# Patient Record
Sex: Female | Born: 1975 | Race: Black or African American | Hispanic: No | Marital: Single | Smoking: Never smoker
Health system: Southern US, Community
[De-identification: ages and names within clinical notes are randomized; demographics above are authoritative.]

## PROBLEM LIST (undated history)

## (undated) DIAGNOSIS — D649 Anemia, unspecified: Secondary | ICD-10-CM

## (undated) DIAGNOSIS — E669 Obesity, unspecified: Secondary | ICD-10-CM

## (undated) DIAGNOSIS — F419 Anxiety disorder, unspecified: Secondary | ICD-10-CM

## (undated) DIAGNOSIS — F99 Mental disorder, not otherwise specified: Secondary | ICD-10-CM

## (undated) HISTORY — DX: Anemia, unspecified: D64.9

## (undated) HISTORY — DX: Obesity, unspecified: E66.9

## (undated) HISTORY — DX: Mental disorder, not otherwise specified: F99

## (undated) HISTORY — DX: Anxiety disorder, unspecified: F41.9

---

## 2004-10-12 ENCOUNTER — Emergency Department (HOSPITAL_COMMUNITY): Admission: EM | Admit: 2004-10-12 | Discharge: 2004-10-12 | Payer: Self-pay | Admitting: Emergency Medicine

## 2007-09-19 ENCOUNTER — Emergency Department (HOSPITAL_COMMUNITY): Admission: EM | Admit: 2007-09-19 | Discharge: 2007-09-20 | Payer: Self-pay | Admitting: Emergency Medicine

## 2007-09-20 ENCOUNTER — Emergency Department (HOSPITAL_COMMUNITY): Admission: EM | Admit: 2007-09-20 | Discharge: 2007-09-20 | Payer: Self-pay | Admitting: Emergency Medicine

## 2007-09-20 ENCOUNTER — Inpatient Hospital Stay (HOSPITAL_COMMUNITY): Admission: AD | Admit: 2007-09-20 | Discharge: 2007-09-25 | Payer: Self-pay | Admitting: *Deleted

## 2007-09-20 ENCOUNTER — Ambulatory Visit: Payer: Self-pay | Admitting: *Deleted

## 2010-12-04 ENCOUNTER — Encounter: Payer: Self-pay | Admitting: Family Medicine

## 2011-03-28 NOTE — Discharge Summary (Signed)
NAME:  Madeline Tucker, Madeline Tucker               ACCOUNT NO.:  [id number]   MEDICAL RECORD NO.:  [id number]          PATIENT TYPE:  IPS   LOCATION:  0400                          FACILITY:  [facility]   PHYSICIAN:  Anselm Jungling, MD  DATE OF BIRTH:  Jan 10, 1976   DATE OF ADMISSION:  09/20/2007  DATE OF DISCHARGE:  09/25/2007                               DISCHARGE SUMMARY   IDENTIFYING DATA AND REASON FOR ADMISSION:  This was an inpatient  psychiatric admission for Madeline Tucker, a 35 year old African American female  admitted due to psychosis and paranoia of 1 week duration.  She was  brought in by her family.  Please refer to the admission note for  further details pertaining to the symptoms, circumstances and history  that led to hospitalization.  She was given initial Axis I diagnosis of  psychosis NOS, and rule out substance-induced psychosis.   MEDICAL AND LABORATORY:  The patient was medically and physically  assessed by the psychiatric nurse practitioner.  She was in good health  without any active or chronic medical problems.  However, tachycardia  was noted throughout her stay, and the etiology of this was not clear.  Review of older [facility] clinical visits indicated that she had had a  previous history of tachycardia, without clear known cause.  There was  no particular suspicion that this was related to the patient's current  medications.  She had no symptoms such as lightheadedness, orthostatic  hypotension, syncope, etc..  It was determined that it was best to have  this followed up by her usual medical physician within 2 weeks following  her discharge.  There were no other significant medical concerns.   HOSPITAL COURSE:  The patient was admitted to the adult inpatient  psychiatric service.  She presented as a well-nourished, well-developed  woman who showed latent speech, disorientation, and dull, flat affect.  She was calm and cooperative.   She was started on a trial of Risperdal with  Depakote.  She responded to  this fairly rapidly, and on the next day she reported that she was  feeling better and stated her opinion that she thought medication was  helping her mood.  She was eating and sleeping well.  However, her level  of insight and judgment was poor, and she was asking for discharge.  She  was continued on the regimen of Risperdal and Depakote.  She continued  to request discharge on a daily basis, but she was not discharged until  the sixth hospital day, at which point she appeared more capable of the  insight and her thinking was more organized.   Her sleep did not remain stable, and staff reported the patient being up  quite a bit during the night.  This raised the possibility that this was  an affective psychosis and it was at this point that Depakote was  determined to be a more important aspect of her overall medication  regimen.  The dose was continued at 1000 mg q.h.s..   The patient was discharged on the sixth hospital day.  The case manager  had been  in contact with the patient's family, and the patient was  discharged to her family, with a supply of her medication.  She agreed  to the following aftercare plan.   AFTERCARE:  The patient was to follow-up with [facility]  in [place], with an appointment on 11/14.  She was to follow-up with  Dr. Alver Fisher at [facility] on 11/19.  She was also to  be seen at [facility] on October 09, 2007.  She  was instructed to follow-up with Dr. Regino Schultze, within 2 weeks regarding  her heart rate.   DISCHARGE MEDICATIONS:  Risperdal 1 mg t.i.d. and 2 mg q.h.s., and  Depakote ER 1000 mg q.h.s.Marland Kitchen  Depakote level was pending at the time of  this dictation.   DISCHARGE DIAGNOSES:  AXIS I: Bipolar disorder, most recently mixed with  psychotic features, resolving.  AXIS II: Deferred.  AXIS III: No acute or chronic illnesses, tachycardia, unknown origin.  AXIS IV:  Stressors severe.  AXIS V: GAF on discharge 55.      Anselm Jungling, MD  Electronically Signed     SPB/MEDQ  D:  09/27/2007  T:  09/27/2007  Job:  [id number]

## 2011-03-28 NOTE — H&P (Signed)
NAME:  Madeline Tucker, BONNIE NO.:  [id number]   MEDICAL RECORD NO.:  [id number]          PATIENT TYPE:  IPS   LOCATION:  0407                          FACILITY:  [facility]   PHYSICIAN:  Jasmine Pang, M.D. DATE OF BIRTH:  08-Sep-1976   DATE OF ADMISSION:  09/20/2007  DATE OF DISCHARGE:                       PSYCHIATRIC ADMISSION ASSESSMENT   REASON FOR ADMISSION:  This is an involuntary admission to the services  of Dr. Milford Cage.   IDENTIFYING INFORMATION/JUSTIFICATION FOR ADMISSION AND CARE:  This is a  35 year old, single, African American female.  The commitment papers  indicate that the examiner felt that the patient had an acute psychotic  break.  She feels she is being stalked and about to be harmed.  She was  extremely paranoid and a potential flight risk due to her paranoia.  Apparently her family had brought her in to [facility], and this is  how she came to be committed.  Today the patient says that a potential  romance became dramatic, and then she got worried about all this and  felt funny in her stomach.  This made her stop eating, and today she  does have the beginnings of a UTI.  She has 3 to 6 WBC in her urine.  She denies ever actually speaking to the gentleman in question, and  completely denies ever having had any sex with him.   PAST PSYCHIATRIC HISTORY:  There is no history for any prior psychiatric  events.   SOCIAL HISTORY:  She states that she went to [employer] for 2 years.  She has  never married.  She has no children.  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   FAMILY HISTORY:  None.   ALCOHOL AND DRUG HISTORY:  She denies.   PRIMARY CARE PHYSICIAN:  Dr. Lucky Cowboy.  Her psychiatrist is Dr.  Vassie Loll.   PAST MEDICAL HISTORY:  None.   CURRENT MEDICATIONS:  None.   DRUG ALLERGIES:  None.   PHYSICAL EXAMINATION:  Positive physical findings:  As already stated,  she does have the beginnings of a UTI with 3 to  6 WBCs in her urine.  The remainder of her physical exam is unremarkable.  She was medically  cleared in the ED at [facility].  VITAL SIGNS ON ADMISSION:  Height 67 inches, weight 180 pounds,  temperature 98.2, blood pressure 158/92, pulse 120, respirations 22.   ADMITTING IMPRESSION:   AXIS I:  Psychosis.   AXIS II:  Deferred.   AXIS III:  None.   AXIS IV:  Severe.   AXIS V:  30.   Dr. Electa Sniff had seen the patient earlier in the day.  He was wondering  if her psychosis could have been drug-induced, but her UDS was negative,  and not really sure if this was just a brief psychotic reaction or  whatever.   PLAN:  1. Admit for safety and stabilization.  2. She was empirically started on Zyprexa Zydis 10 mg p.o. p.r.n.      agitation.  3. She was ordered Risperdal 0.5 mg  p.o. t.i.d. today.  4. Will be also getting some Cipro 250 mg p.o. b.i.d. for UTI x3 days.  5. Will have the case manager have a session with her family to see if      she has cleared.      Mickie Leonarda Salon, P.A.-C.      Jasmine Pang, M.D.  Electronically Signed    MD/MEDQ  D:  09/21/2007  T:  09/22/2007  Job:  [id number]

## 2011-08-22 LAB — BASIC METABOLIC PANEL
BUN: 5 — ABNORMAL LOW
BUN: 5 — ABNORMAL LOW
CO2: 31
Creatinine, Ser: 0.69
Creatinine, Ser: 0.87
GFR calc Af Amer: >60
GFR calc non Af Amer: >60

## 2011-08-22 LAB — URINALYSIS, ROUTINE W REFLEX MICROSCOPIC
Glucose, UA: NEGATIVE
Ketones, ur: 15 — AB
Leukocytes, UA: NEGATIVE
Nitrite: NEGATIVE
Specific Gravity, Urine: 1.02
Urobilinogen, UA: 1
pH: 5.5
pH: 6.5

## 2011-08-22 LAB — CBC
HCT: 41.5
MCHC: 33.6
MCV: 86
Platelets: 256
RBC: 4.83
RDW: 13.5
WBC: 9.2
WBC: 9.6

## 2011-08-22 LAB — DIFFERENTIAL
Eosinophils Absolute: 0
Eosinophils Relative: 0
Lymphocytes Relative: 13
Lymphs Abs: 1.2
Lymphs Abs: 1.8
Monocytes Absolute: 0.3
Monocytes Relative: 4
Neutro Abs: 7.9 — ABNORMAL HIGH
Neutrophils Relative %: 76
Neutrophils Relative %: 82 — ABNORMAL HIGH

## 2011-08-22 LAB — RAPID URINE DRUG SCREEN, HOSP PERFORMED
Amphetamines: NOT DETECTED
Barbiturates: NOT DETECTED
Cocaine: NOT DETECTED
Opiates: NOT DETECTED
Tetrahydrocannabinol: NOT DETECTED

## 2011-08-22 LAB — URINE MICROSCOPIC-ADD ON

## 2011-08-22 LAB — PREGNANCY, URINE
Preg Test, Ur: NEGATIVE
Preg Test, Ur: NEGATIVE

## 2011-08-22 LAB — ETHANOL: Alcohol, Ethyl (B): <5

## 2011-08-22 LAB — VALPROIC ACID LEVEL: Valproic Acid Lvl: <1 — ABNORMAL LOW

## 2020-08-16 ENCOUNTER — Other Ambulatory Visit (HOSPITAL_COMMUNITY): Payer: Self-pay | Admitting: Nurse Practitioner

## 2020-08-16 DIAGNOSIS — Z1231 Encounter for screening mammogram for malignant neoplasm of breast: Secondary | ICD-10-CM

## 2020-08-24 ENCOUNTER — Other Ambulatory Visit: Payer: Self-pay | Admitting: *Deleted

## 2020-08-24 ENCOUNTER — Other Ambulatory Visit (HOSPITAL_COMMUNITY): Payer: Self-pay | Admitting: *Deleted

## 2020-08-24 DIAGNOSIS — E041 Nontoxic single thyroid nodule: Secondary | ICD-10-CM

## 2020-08-27 ENCOUNTER — Other Ambulatory Visit: Payer: Self-pay | Admitting: *Deleted

## 2020-08-27 ENCOUNTER — Other Ambulatory Visit (HOSPITAL_COMMUNITY): Payer: Self-pay | Admitting: *Deleted

## 2020-08-27 DIAGNOSIS — R109 Unspecified abdominal pain: Secondary | ICD-10-CM

## 2020-08-27 DIAGNOSIS — N912 Amenorrhea, unspecified: Secondary | ICD-10-CM

## 2020-08-27 DIAGNOSIS — R1013 Epigastric pain: Secondary | ICD-10-CM

## 2020-09-06 ENCOUNTER — Other Ambulatory Visit: Payer: Self-pay

## 2020-09-06 ENCOUNTER — Ambulatory Visit (HOSPITAL_COMMUNITY)
Admission: RE | Admit: 2020-09-06 | Discharge: 2020-09-06 | Disposition: A | Discharge status: Home / Self Care | Payer: Medicaid Other | Source: Ambulatory Visit | Attending: *Deleted | Admitting: *Deleted

## 2020-09-06 DIAGNOSIS — R1013 Epigastric pain: Secondary | ICD-10-CM

## 2020-09-06 DIAGNOSIS — E041 Nontoxic single thyroid nodule: Secondary | ICD-10-CM | POA: Insufficient documentation

## 2020-09-06 DIAGNOSIS — R109 Unspecified abdominal pain: Secondary | ICD-10-CM | POA: Diagnosis present

## 2020-09-06 DIAGNOSIS — N912 Amenorrhea, unspecified: Secondary | ICD-10-CM | POA: Insufficient documentation

## 2020-09-08 ENCOUNTER — Other Ambulatory Visit: Payer: Self-pay

## 2020-09-08 ENCOUNTER — Ambulatory Visit (HOSPITAL_COMMUNITY)
Admission: RE | Admit: 2020-09-08 | Discharge: 2020-09-08 | Disposition: A | Discharge status: Home / Self Care | Payer: Medicaid Other | Source: Ambulatory Visit | Attending: Nurse Practitioner | Admitting: Nurse Practitioner

## 2020-09-08 DIAGNOSIS — Z1231 Encounter for screening mammogram for malignant neoplasm of breast: Secondary | ICD-10-CM

## 2020-09-13 ENCOUNTER — Encounter: Payer: Self-pay | Admitting: Internal Medicine

## 2020-09-21 ENCOUNTER — Other Ambulatory Visit (HOSPITAL_COMMUNITY): Payer: Self-pay | Admitting: *Deleted

## 2020-09-21 DIAGNOSIS — R1031 Right lower quadrant pain: Secondary | ICD-10-CM

## 2020-09-21 DIAGNOSIS — R9389 Abnormal findings on diagnostic imaging of other specified body structures: Secondary | ICD-10-CM

## 2020-10-05 ENCOUNTER — Encounter (HOSPITAL_COMMUNITY): Payer: Self-pay

## 2020-10-05 ENCOUNTER — Ambulatory Visit (HOSPITAL_COMMUNITY): Payer: Medicaid Other

## 2020-10-15 ENCOUNTER — Other Ambulatory Visit: Payer: Self-pay | Admitting: *Deleted

## 2020-10-15 ENCOUNTER — Other Ambulatory Visit (HOSPITAL_COMMUNITY): Payer: Self-pay | Admitting: *Deleted

## 2020-10-15 DIAGNOSIS — E041 Nontoxic single thyroid nodule: Secondary | ICD-10-CM

## 2020-10-20 ENCOUNTER — Ambulatory Visit: Payer: Medicaid Other | Admitting: Internal Medicine

## 2020-10-27 ENCOUNTER — Other Ambulatory Visit: Payer: Self-pay

## 2020-10-27 ENCOUNTER — Encounter (HOSPITAL_COMMUNITY): Payer: Self-pay

## 2020-10-27 ENCOUNTER — Ambulatory Visit (HOSPITAL_COMMUNITY)
Admission: RE | Admit: 2020-10-27 | Discharge: 2020-10-27 | Disposition: A | Discharge status: Home / Self Care | Payer: Medicaid Other | Source: Ambulatory Visit | Attending: *Deleted | Admitting: *Deleted

## 2020-10-27 DIAGNOSIS — E041 Nontoxic single thyroid nodule: Secondary | ICD-10-CM | POA: Insufficient documentation

## 2020-10-27 MED ORDER — LIDOCAINE HCL (PF) 2 % IJ SOLN
INTRAMUSCULAR | Status: AC
  Administered 2020-10-27: 10 mL via INTRADERMAL
  Filled 2020-10-27: qty 10

## 2020-10-27 MED ORDER — LIDOCAINE HCL (PF) 2 % IJ SOLN: 10.0000 mL | Freq: Once | INTRAMUSCULAR | Status: AC

## 2020-10-27 NOTE — Discharge Instructions (Signed)
Thyroid Needle Biopsy, Care After This sheet gives you information about how to care for yourself after your procedure. Your health care provider may also give you more specific instructions. If you have problems or questions, contact your health care provider. What can I expect after the procedure? After the procedure, it is common to have:  Soreness and tenderness that lasts for a few days.  Bruising where the needle was inserted (puncture site). Follow these instructions at home:   Take over-the-counter and prescription medicines only as told by your health care provider.  To help ease discomfort, keep your head raised (elevated) when you are lying down. When you move from lying down to sitting up, use both hands to support the back of your head and neck.  Check your puncture site every day for signs of infection. Check for: ? Redness, swelling, or pain. ? Fluid or blood. ? Warmth. ? Pus or a bad smell.  Return to your normal activities as told by your health care provider. Ask your health care provider what activities are safe for you.  Keep all follow-up visits as told by your health care provider. This is important. Contact a health care provider if:  You have redness, swelling, or pain around your puncture site.  You have fluid or blood coming from your puncture site.  Your puncture site feels warm to the touch.  You have pus or a bad smell coming from your puncture site.  You have a fever. Get help right away if:  You have severe bleeding from the puncture site.  You have difficulty swallowing.  You have swollen glands (lymph nodes) in your neck. Summary  It is common to have some bruising and soreness where the needle was inserted in your lower front neck area (puncture site).  Check your puncture site every day for signs of infection, such as redness, swelling, or pain.  Get help right away if you have severe bleeding from your puncture site. This  information is not intended to replace advice given to you by your health care provider. Make sure you discuss any questions you have with your health care provider. Document Revised: 10/12/2017 Document Reviewed: 08/13/2017 Elsevier Patient Education  2020 Elsevier Inc.  

## 2020-10-27 NOTE — Sedation Documentation (Signed)
PT tolerated left mid thyroid biopsy procedure well today. Labs obtained and sent for pathology at 1155. PT ambulatory at discharge with no acute distress noted and verbalized understanding of discharge instructions.

## 2020-10-28 LAB — CYTOLOGY - NON PAP

## 2020-11-01 ENCOUNTER — Encounter: Payer: Self-pay | Admitting: Internal Medicine

## 2020-11-02 ENCOUNTER — Ambulatory Visit (HOSPITAL_COMMUNITY)
Admission: RE | Admit: 2020-11-02 | Discharge: 2020-11-02 | Disposition: A | Discharge status: Home / Self Care | Payer: Medicaid Other | Source: Ambulatory Visit | Attending: *Deleted | Admitting: *Deleted

## 2020-11-02 ENCOUNTER — Other Ambulatory Visit: Payer: Self-pay

## 2020-11-02 DIAGNOSIS — R1031 Right lower quadrant pain: Secondary | ICD-10-CM | POA: Diagnosis present

## 2020-11-02 DIAGNOSIS — R9389 Abnormal findings on diagnostic imaging of other specified body structures: Secondary | ICD-10-CM | POA: Insufficient documentation

## 2020-11-02 MED ORDER — GADOBUTROL 1 MMOL/ML IV SOLN
10.0000 mL | Freq: Once | INTRAVENOUS | Status: AC | PRN
  Administered 2020-11-02: 10 mL via INTRAVENOUS

## 2020-11-22 ENCOUNTER — Encounter: Payer: Self-pay | Admitting: Obstetrics & Gynecology

## 2020-11-22 ENCOUNTER — Other Ambulatory Visit: Payer: Self-pay

## 2020-11-22 ENCOUNTER — Ambulatory Visit (INDEPENDENT_AMBULATORY_CARE_PROVIDER_SITE_OTHER): Payer: Medicaid Other | Admitting: Obstetrics & Gynecology

## 2020-11-22 VITALS — BP 136/80 | HR 84 | Wt 232.0 lb

## 2020-11-22 DIAGNOSIS — N911 Secondary amenorrhea: Secondary | ICD-10-CM | POA: Diagnosis not present

## 2020-11-22 MED ORDER — MEDROXYPROGESTERONE ACETATE 2.5 MG PO TABS: 2.5000 mg | ORAL_TABLET | Freq: Every day | ORAL | 11 refills | Status: DC

## 2020-11-22 NOTE — Progress Notes (Signed)
Chief Complaint  Patient presents with  . Post Menopausal Bleeding    Pt has not had a period since 2008.       44 y.o. G0P0000 No LMP recorded (lmp unknown). Patient is postmenopausal. The current method of family planning is none.  Outpatient Encounter Medications as of 11/22/2020  Medication Sig  . medroxyPROGESTERone (PROVERA) 2.5 MG tablet Take 1 tablet (2.5 mg total) by mouth daily.   No facility-administered encounter medications on file as of 11/22/2020.    Subjective Pt had not had a period for 12-13 years and bled in December for 7 days Recent sonogram normal Not sexually active Past Medical History:  Diagnosis Date  . Anxiety   . Mental disorder     History reviewed. No pertinent surgical history.  OB History    Gravida  0   Para  0   Term  0   Preterm  0   AB  0   Living  0     SAB  0   IAB  0   Ectopic  0   Multiple  0   Live Births  0           No Known Allergies  Social History   Socioeconomic History  . Marital status: Single    Spouse name: Not on file  . Number of children: Not on file  . Years of education: Not on file  . Highest education level: Not on file  Occupational History  . Not on file  Tobacco Use  . Smoking status: Never Smoker  . Smokeless tobacco: Never Used  Vaping Use  . Vaping Use: Never used  Substance and Sexual Activity  . Alcohol use: Never  . Drug use: Never  . Sexual activity: Never    Birth control/protection: None  Other Topics Concern  . Not on file  Social History Narrative  . Not on file   Social Determinants of Health   Financial Resource Strain: Low Risk   . Difficulty of Paying Living Expenses: Not hard at all  Food Insecurity: No Food Insecurity  . Worried About Charity fundraiser in the Last Year: Never true  . Ran Out of Food in the Last Year: Never true  Transportation Needs: No Transportation Needs  . Lack of Transportation (Medical): No  . Lack of  Transportation (Non-Medical): No  Physical Activity: Insufficiently Active  . Days of Exercise per Week: 2 days  . Minutes of Exercise per Session: 10 min  Stress: No Stress Concern Present  . Feeling of Stress : Only a little  Social Connections: Socially Isolated  . Frequency of Communication with Friends and Family: Never  . Frequency of Social Gatherings with Friends and Family: Never  . Attends Religious Services: Never  . Active Member of Clubs or Organizations: Yes  . Attends Archivist Meetings: Never  . Marital Status: Never married    Family History  Problem Relation Age of Onset  . Cancer Mother   . Diabetes Mother     Medications:       Current Outpatient Medications:  .  medroxyPROGESTERone (PROVERA) 2.5 MG tablet, Take 1 tablet (2.5 mg total) by mouth daily., Disp: 30 tablet, Rfl: 11  Objective Blood pressure 136/80, pulse 84, weight 232 lb (105.2 kg).  Gen WDWN NAD some mental deficiency  Pt delcines Pap or pelvic exam  Pertinent ROS No burning with urination, frequency or urgency No nausea, vomiting or  diarrhea Nor fever chills or other constitutional symptoms   Labs or studies  Reviewed labs and sonogram which are all normal   Impression Diagnoses this Encounter::   ICD-10-CM   1. Secondary amenorrhea  N91.1     Established relevant diagnosis(es):   Plan/Recommendations: Meds ordered this encounter  Medications  . medroxyPROGESTERone (PROVERA) 2.5 MG tablet    Sig: Take 1 tablet (2.5 mg total) by mouth daily.    Dispense:  30 tablet    Refill:  11    Labs or Scans Ordered: No orders of the defined types were placed in this encounter.   Management:: Begin daily provera 2.5 mg to provide endometrial protection from anovulation and lack of cyclical provera  Follow up Return in about 6 months (around 05/22/2021) for Follow up, with Dr Elonda Husky.        Face to face time:  15 minutes  Greater than 50% of the visit time was  spent in counseling and coordination of care with the patient.  The summary and outline of the counseling and care coordination is summarized in the note above.   All questions were answered.

## 2020-11-25 ENCOUNTER — Other Ambulatory Visit: Payer: Self-pay

## 2020-11-25 ENCOUNTER — Ambulatory Visit (INDEPENDENT_AMBULATORY_CARE_PROVIDER_SITE_OTHER): Payer: Medicaid Other | Admitting: Internal Medicine

## 2020-11-25 ENCOUNTER — Encounter: Payer: Self-pay | Admitting: *Deleted

## 2020-11-25 ENCOUNTER — Encounter: Payer: Self-pay | Admitting: Internal Medicine

## 2020-11-25 VITALS — BP 115/82 | HR 97 | Temp 96.8°F | Ht 66.0 in | Wt 228.0 lb

## 2020-11-25 DIAGNOSIS — R1031 Right lower quadrant pain: Secondary | ICD-10-CM | POA: Diagnosis not present

## 2020-11-25 DIAGNOSIS — R1032 Left lower quadrant pain: Secondary | ICD-10-CM | POA: Diagnosis not present

## 2020-11-25 NOTE — Progress Notes (Signed)
Primary Care Physician:  Patient, No Pcp Per Primary Gastroenterologist:  Dr. Abbey Chatters  Chief Complaint  Patient presents with  . Abdominal Pain    Right and upper abd    HPI:   Madeline Tucker is a 45 y.o. female who presents to the clinic today by referral from her PCP [facility] for evaluation for abdominal pain.  Patient states her abdominal pain is primarily right lower quadrant radiates to the left lower quadrant as well.  States this is constant pain, 5 out of 10.  Notes abdominal tenderness as well.  Denies any change in her bowel habits.  No constipation or diarrhea.  No melena hematochezia.  No unintentional weight loss.  No previous colonoscopy, denies any family history of colorectal malignancy.  Denies any upper GI symptoms including reflux, heartburn, dysphagia/odynophagia, chest pain.  She had an abdominal ultrasound 09/06/2020 which showed normal gallbladder but interestingly showed questionable subtle masses up to 15 mm in the liver.  Subsequent MRI abdomen showed normal-appearing liver.  Patient was evaluated by gynecology and had a pelvic ultrasound which was WNL  Past Medical History:  Diagnosis Date  . Anxiety   . Mental disorder     No past surgical history on file.  Current Outpatient Medications  Medication Sig Dispense Refill  . medroxyPROGESTERone (PROVERA) 2.5 MG tablet Take 1 tablet (2.5 mg total) by mouth daily. 30 tablet 11   No current facility-administered medications for this visit.    Allergies as of 11/25/2020  . (No Known Allergies)    Family History  Problem Relation Age of Onset  . Cancer Mother   . Diabetes Mother     Social History   Socioeconomic History  . Marital status: Single    Spouse name: Not on file  . Number of children: Not on file  . Years of education: Not on file  . Highest education level: Not on file  Occupational History  . Not on file  Tobacco Use  . Smoking status: Never Smoker  . Smokeless tobacco:  Never Used  Vaping Use  . Vaping Use: Never used  Substance and Sexual Activity  . Alcohol use: Never  . Drug use: Never  . Sexual activity: Never    Birth control/protection: None  Other Topics Concern  . Not on file  Social History Narrative  . Not on file   Social Determinants of Health   Financial Resource Strain: Low Risk   . Difficulty of Paying Living Expenses: Not hard at all  Food Insecurity: No Food Insecurity  . Worried About Charity fundraiser in the Last Year: Never true  . Ran Out of Food in the Last Year: Never true  Transportation Needs: No Transportation Needs  . Lack of Transportation (Medical): No  . Lack of Transportation (Non-Medical): No  Physical Activity: Insufficiently Active  . Days of Exercise per Week: 2 days  . Minutes of Exercise per Session: 10 min  Stress: No Stress Concern Present  . Feeling of Stress : Only a little  Social Connections: Socially Isolated  . Frequency of Communication with Friends and Family: Never  . Frequency of Social Gatherings with Friends and Family: Never  . Attends Religious Services: Never  . Active Member of Clubs or Organizations: Yes  . Attends Archivist Meetings: Never  . Marital Status: Never married  Intimate Partner Violence: Not At Risk  . Fear of Current or Ex-Partner: No  . Emotionally Abused: No  .  Physically Abused: No  . Sexually Abused: No    Subjective: Review of Systems  Constitutional: Negative for chills and fever.  HENT: Negative for congestion and hearing loss.   Eyes: Negative for blurred vision and double vision.  Respiratory: Negative for cough and shortness of breath.   Cardiovascular: Negative for chest pain and palpitations.  Gastrointestinal: Positive for abdominal pain. Negative for blood in stool, constipation, diarrhea, heartburn, melena and vomiting.  Genitourinary: Negative for dysuria and urgency.  Musculoskeletal: Negative for joint pain and myalgias.  Skin:  Negative for itching and rash.  Neurological: Negative for dizziness and headaches.  Psychiatric/Behavioral: Negative for depression. The patient is not nervous/anxious.        Objective: BP 115/82   Pulse 97   Temp (!) 96.8 F (36 C) (Temporal)   Ht 5\' 6"  (1.676 m)   Wt 228 lb (103.4 kg)   LMP  (LMP Unknown)   BMI 36.80 kg/m  Physical Exam Constitutional:      Appearance: Normal appearance. She is obese.  HENT:     Head: Normocephalic and atraumatic.  Eyes:     Extraocular Movements: Extraocular movements intact.     Conjunctiva/sclera: Conjunctivae normal.  Cardiovascular:     Rate and Rhythm: Normal rate and regular rhythm.  Pulmonary:     Effort: Pulmonary effort is normal.     Breath sounds: Normal breath sounds.  Abdominal:     General: Bowel sounds are normal.     Palpations: Abdomen is soft.  Musculoskeletal:        General: No swelling. Normal range of motion.     Cervical back: Normal range of motion and neck supple.  Skin:    General: Skin is warm and dry.     Coloration: Skin is not jaundiced.  Neurological:     General: No focal deficit present.     Mental Status: She is alert and oriented to person, place, and time.  Psychiatric:        Mood and Affect: Mood normal.        Behavior: Behavior normal.      Assessment: *Abdominal pain-chronic lower left and lower right quadrant  Plan: Etiology of patient's abdominal pain unclear.  She has had MRI of her abdomen which was unremarkable though this did not evaluate the pelvis.  She did have a pelvic ultrasound done which was unremarkable.  Given her worsening symptoms we will evaluate with colonoscopy to rule out colitis, diverticulitis, polyps, masses, or other.  The risks including infection, bleed, or perforation as well as benefits, limitations, alternatives and imponderables have been reviewed with the patient. Questions have been answered. All parties agreeable.  Thank you Royce Macadamia for the  kind referral.  11/25/2020 4:29 PM   Disclaimer: This note was dictated with voice recognition software. Similar sounding words can inadvertently be transcribed and may not be corrected upon review.

## 2020-11-25 NOTE — Patient Instructions (Signed)
We will schedule you for colonoscopy to further evaluate your abdominal pain.  Further recommendations to follow.  At [facility] we value your feedback. You may receive a survey about your visit today. Please share your experience as we strive to create trusting relationships with our patients to provide genuine, compassionate, quality care.  We appreciate your understanding and patience as we review any laboratory studies, imaging, and other diagnostic tests that are ordered as we care for you. Our office policy is 5 business days for review of these results, and any emergent or urgent results are addressed in a timely manner for your best interest. If you do not hear from our office in 1 week, please contact us.   We also encourage the use of MyChart, which contains your medical information for your review as well. If you are not enrolled in this feature, an access code is on this after visit summary for your convenience. Thank you for allowing [place] to be involved in your care.  It was great to see you today!  I hope you have a great rest of your winter!!    Madeline Tucker. Abbey Chatters, D.O. Gastroenterology and Hepatology [facility]

## 2020-11-26 ENCOUNTER — Telehealth: Payer: Self-pay | Admitting: Internal Medicine

## 2020-11-26 NOTE — Telephone Encounter (Signed)
LMOVM for pt to call back. Yes she still needs someone with her on transport van. Driver can not be responsible for her.

## 2020-11-26 NOTE — Telephone Encounter (Signed)
Pt is scheduled with Dr Abbey Chatters for a procedure on 2/14 and she will be riding the transportation [place]. Does she still need someone with her. I told her she would since she would be sedated, but she wants to know why.  [phone]

## 2020-12-20 ENCOUNTER — Encounter (HOSPITAL_COMMUNITY): Payer: Self-pay | Admitting: Emergency Medicine

## 2020-12-20 ENCOUNTER — Other Ambulatory Visit: Payer: Self-pay

## 2020-12-20 ENCOUNTER — Telehealth: Payer: Self-pay | Admitting: Obstetrics & Gynecology

## 2020-12-20 ENCOUNTER — Emergency Department (HOSPITAL_COMMUNITY)
Admission: EM | Admit: 2020-12-20 | Discharge: 2020-12-21 | Disposition: A | Discharge status: Home / Self Care | Payer: Medicaid Other | Attending: Emergency Medicine | Admitting: Emergency Medicine

## 2020-12-20 DIAGNOSIS — Z114 Encounter for screening for human immunodeficiency virus [HIV]: Secondary | ICD-10-CM | POA: Diagnosis not present

## 2020-12-20 DIAGNOSIS — R197 Diarrhea, unspecified: Secondary | ICD-10-CM | POA: Insufficient documentation

## 2020-12-20 DIAGNOSIS — R112 Nausea with vomiting, unspecified: Secondary | ICD-10-CM

## 2020-12-20 DIAGNOSIS — R109 Unspecified abdominal pain: Secondary | ICD-10-CM | POA: Insufficient documentation

## 2020-12-20 DIAGNOSIS — N939 Abnormal uterine and vaginal bleeding, unspecified: Secondary | ICD-10-CM | POA: Diagnosis not present

## 2020-12-20 DIAGNOSIS — Z79899 Other long term (current) drug therapy: Secondary | ICD-10-CM | POA: Diagnosis not present

## 2020-12-20 NOTE — ED Triage Notes (Addendum)
Pt states she has been having vaginal bleeding, vomiting, and diarrhea x 3 weeks. States she cannot keep anything down. Pt also states she has been having abdominal pain " since October".

## 2020-12-20 NOTE — ED Provider Notes (Signed)
[facility] EMERGENCY DEPARTMENT Provider Note   CSN: [id number] Arrival date & time: 12/20/20  2325   History Chief Complaint  Patient presents with  . Multiple Complaints    Madeline Tucker is a 45 y.o. female.  The history is provided by the patient.  She has a history of anxiety and comes in with multiple complaints.  It is difficult to put in the history together as she is a very poor and vague historian.  However, it appears that she had amenorrhea for about 12 years.  She had a menses in December and saw gynecologist who put her on medroxyprogesterone about 1 month ago.  About 3 weeks ago, she started having heavy vaginal bleeding.  She states that she is passing clots and she is going through a bag of pads a day but cannot tell me the number.  She has also been having got nausea and vomiting during that same timeframe and states she has not been able to hold anything down.  She vomits 2-4 times a day.  She has also been having some loose bowel movements twice a day.  She had been having generalized abdominal pain for the last 3-4 months and has seen a gastroenterologist and is scheduled to have a colonoscopy next week.  She came in tonight because her pain seemed to be getting worse.  Her pain is constant and she states that nothing makes it better.  It is not affected by lying down, standing, walking, eating, defecating.  She denies fever chills or sweats.  Past Medical History:  Diagnosis Date  . Anxiety   . Mental disorder     Patient Active Problem List   Diagnosis Date Noted  . RLQ abdominal pain 11/25/2020  . LLQ abdominal pain 11/25/2020    History reviewed. No pertinent surgical history.   OB History    Gravida  0   Para  0   Term  0   Preterm  0   AB  0   Living  0     SAB  0   IAB  0   Ectopic  0   Multiple  0   Live Births  0           Family History  Problem Relation Age of Onset  . Cancer Mother   . Diabetes Mother     Social  History   Tobacco Use  . Smoking status: Never Smoker  . Smokeless tobacco: Never Used  Vaping Use  . Vaping Use: Never used  Substance Use Topics  . Alcohol use: Never  . Drug use: Never    Home Medications Prior to Admission medications   Medication Sig Start Date End Date Taking? Authorizing Provider  chlorhexidine (PERIDEX) 0.12 % solution 15 mLs by Mouth Rinse route 2 (two) times daily. 12/14/20   [provider]  medroxyPROGESTERone (PROVERA) 2.5 MG tablet Take 1 tablet (2.5 mg total) by mouth daily. 11/22/20   Florian Buff, MD    Allergies    Patient has no known allergies.  Review of Systems   Review of Systems  All other systems reviewed and are negative.   Physical Exam Updated Vital Signs BP 123/70 (BP Location: Right Arm)   Pulse (!) 102   Temp 98.5 F (36.9 C) (Oral)   Resp 20   Ht 5\' 6"  (1.676 m)   Wt 103 kg   LMP 12/20/2020   SpO2 96%   BMI 36.65 kg/m  Physical Exam Vitals and nursing note reviewed.   45 year old female, resting comfortably and in no acute distress. Vital signs are significant for borderline elevated heart rate. Oxygen saturation is 96%, which is normal. Head is normocephalic and atraumatic. PERRLA, EOMI. Oropharynx is clear. Neck is nontender and supple without adenopathy or JVD. Back is nontender and there is no CVA tenderness. Lungs are clear without rales, wheezes, or rhonchi. Chest is nontender. Heart has regular rate and rhythm without murmur. Abdomen is soft, flat, with mild tenderness diffusely.  There is no rebound or guarding.  There are no masses or hepatosplenomegaly and peristalsis is normoactive. Pelvic: Normal external female genitalia. Patient would not allow speculum exam, but from what I could see in the vaginal vault there was only a small amount of blood. Extremities have no cyanosis or edema, full range of motion is present. Skin is warm and dry without rash. Neurologic: Mental status is normal,  cranial nerves are intact, there are no motor or sensory deficits.  ED Results / Procedures / Treatments   Labs (all labs ordered are listed, but only abnormal results are displayed) Labs Reviewed  COMPREHENSIVE METABOLIC PANEL - Abnormal; Notable for the following components:      Result Value   Glucose, Bld 109 (*)    Calcium 8.8 (*)    AST 12 (*)    All other components within normal limits  CBC WITH DIFFERENTIAL/PLATELET - Abnormal; Notable for the following components:   WBC 12.8 (*)    Hemoglobin 11.9 (*)    Neutro Abs 11.0 (*)    All other components within normal limits  URINALYSIS, ROUTINE W REFLEX MICROSCOPIC - Abnormal; Notable for the following components:   APPearance CLOUDY (*)    Hgb urine dipstick LARGE (*)    Ketones, ur 20 (*)    Protein, ur 30 (*)    Leukocytes,Ua TRACE (*)    Bacteria, UA RARE (*)    All other components within normal limits  RPR  HIV ANTIBODY (ROUTINE TESTING W REFLEX)  I-STAT BETA HCG BLOOD, ED ([facility], [facility], [facility] ONLY)  GC/CHLAMYDIA PROBE AMP ([place]) NOT AT [facility]   Radiology CT ABDOMEN PELVIS WO CONTRAST  Result Date: 12/21/2020 CLINICAL DATA:  45 year old female with vaginal bleeding, diarrhea, vomiting. Chronic abdominal pain. EXAM: CT ABDOMEN AND PELVIS WITHOUT CONTRAST TECHNIQUE: Multidetector CT imaging of the abdomen and pelvis was performed following the standard protocol without IV contrast. COMPARISON:  Abdomen MRI without and with contrast 11/02/2020. FINDINGS: Lower chest: Negative; minor lung base atelectasis. Hepatobiliary: Negative noncontrast liver and gallbladder. Pancreas: Negative. Spleen: Negative. Adrenals/Urinary Tract: Normal adrenal glands. Noncontrast kidneys appear symmetric and normal. Left renal collecting system might be duplicated (normal variant). No nephrolithiasis or hydronephrosis. Decompressed ureters. Decompressed and negative urinary bladder. Stomach/Bowel: Oral contrast was administered and has reached the  rectum without obstruction. Highly redundant but mostly decompressed sigmoid colon. The left colon is medial to small bowel in the abdomen. Unremarkable transverse and right colon. Normal appendix containing contrast (coronal image 40). Decompressed terminal ileum. No dilated or abnormal small bowel in the abdomen. Decompressed stomach. Normal duodenum and ligament of Treitz. No free air or free fluid. No mesenteric stranding. Vascular/Lymphatic: Normal caliber abdominal aorta. No atherosclerosis is evident. Vascular patency is not evaluated in the absence of IV contrast. No lymphadenopathy. Reproductive: Evidence of bilateral ovarian cysts, but overall noncontrast appearance of the ovary is within normal limits for a premenopausal patient. There is questionable enlargement of the lower  uterine segment (sagittal image 58). Otherwise noncontrast uterus is within normal limits. Other: No pelvic free fluid. Musculoskeletal: Negative. IMPRESSION: 1. Negative CT appearance of the abdomen without IV contrast. Oral contrast was administered and has reached the rectum. Normal appendix. Highly redundant sigmoid colon. 2. Questionable enlargement of the lower uterine segment/cervix. Recommend correlation with pelvic exam to exclude cervical mass. Physiologic bilateral ovarian cysts suspected. Electronically Signed   By: Genevie Ann M.D.   On: 12/21/2020 05:49    Procedures Procedures   Medications Ordered in ED Medications  ondansetron (ZOFRAN-ODT) disintegrating tablet 8 mg (8 mg Oral Given 12/21/20 0109)    ED Course  I have reviewed the triage vital signs and the nursing notes.  Pertinent labs & imaging results that were available during my care of the patient were reviewed by me and considered in my medical decision making (see chart for details).  MDM Rules/Calculators/A&P Abdominal pain, vomiting, vaginal bleeding.  Cause of her problems is not clear.  It seems that her vaginal bleeding actually started about  7-10 days after starting to take medroxyprogesterone.  Old records are reviewed, and it is noted that she had an essentially normal pelvic ultrasound on 10/25, and a normal MRI of the abdomen on 12/21.  Gynecology consult on 1/10 for secondary amenorrhea with prescription for daily medroxyprogesterone.  Consult with gastroenterology on 1/13 with plan for colonoscopy which is scheduled for 2/14.  It is not clear how her symptoms are related.  Will check screening labs and consider getting CT of abdomen and pelvis.  Labs are unremarkable.  She is sent for CT of abdomen and pelvis which is also unremarkable.  She had no further vomiting after receiving ondansetron in the ED.  Patient is reassured that her work-up appears to be benign, she is discharged with prescription for ondansetron and is referred back to her gynecologist and gastroenterologist.  Final Clinical Impression(s) / ED Diagnoses Final diagnoses:  Abdominal pain, unspecified abdominal location  Abnormal vaginal bleeding  Non-intractable vomiting with nausea, unspecified vomiting type    Rx / DC Orders ED Discharge Orders         Ordered    ondansetron (ZOFRAN) 4 MG tablet  Every 6 hours PRN        12/21/20 [id number]           Delora Fuel, MD 0000000 [phone]

## 2020-12-21 ENCOUNTER — Telehealth: Payer: Self-pay | Admitting: Internal Medicine

## 2020-12-21 ENCOUNTER — Emergency Department (HOSPITAL_COMMUNITY): Payer: Medicaid Other

## 2020-12-21 LAB — URINALYSIS, ROUTINE W REFLEX MICROSCOPIC
Bilirubin Urine: NEGATIVE
Glucose, UA: NEGATIVE mg/dL
Ketones, ur: 20 mg/dL — AB
Nitrite: NEGATIVE
Protein, ur: 30 mg/dL — AB
Specific Gravity, Urine: 1.021 (ref 1.005–1.030)
pH: 5 (ref 5.0–8.0)

## 2020-12-21 LAB — CBC WITH DIFFERENTIAL/PLATELET
Abs Immature Granulocytes: 0.06 10*3/uL (ref 0.00–0.07)
Basophils Absolute: 0 10*3/uL (ref 0.0–0.1)
Basophils Relative: 0 %
Eosinophils Absolute: 0 10*3/uL (ref 0.0–0.5)
Eosinophils Relative: 0 %
HCT: 36.7 % (ref 36.0–46.0)
Hemoglobin: 11.9 g/dL — ABNORMAL LOW (ref 12.0–15.0)
Immature Granulocytes: 1 %
Lymphocytes Relative: 10 %
Lymphs Abs: 1.2 10*3/uL (ref 0.7–4.0)
MCH: 29 pg (ref 26.0–34.0)
MCHC: 32.4 g/dL (ref 30.0–36.0)
MCV: 89.3 fL (ref 80.0–100.0)
Monocytes Absolute: 0.5 10*3/uL (ref 0.1–1.0)
Monocytes Relative: 4 %
Neutro Abs: 11 10*3/uL — ABNORMAL HIGH (ref 1.7–7.7)
Neutrophils Relative %: 85 %
Platelets: 303 10*3/uL (ref 150–400)
RBC: 4.11 MIL/uL (ref 3.87–5.11)
RDW: 13.7 % (ref 11.5–15.5)
WBC: 12.8 10*3/uL — ABNORMAL HIGH (ref 4.0–10.5)
nRBC: 0 % (ref 0.0–0.2)

## 2020-12-21 LAB — COMPREHENSIVE METABOLIC PANEL
ALT: 11 U/L (ref 0–44)
AST: 12 U/L — ABNORMAL LOW (ref 15–41)
Albumin: 3.8 g/dL (ref 3.5–5.0)
Alkaline Phosphatase: 71 U/L (ref 38–126)
Anion gap: 7 (ref 5–15)
BUN: 7 mg/dL (ref 6–20)
CO2: 23 mmol/L (ref 22–32)
Calcium: 8.8 mg/dL — ABNORMAL LOW (ref 8.9–10.3)
Chloride: 106 mmol/L (ref 98–111)
Creatinine, Ser: 0.62 mg/dL (ref 0.44–1.00)
GFR, Estimated: >60 mL/min (ref >60)
Glucose, Bld: 109 mg/dL — ABNORMAL HIGH (ref 70–99)
Potassium: 3.9 mmol/L (ref 3.5–5.1)
Sodium: 136 mmol/L (ref 135–145)
Total Bilirubin: 0.6 mg/dL (ref 0.3–1.2)
Total Protein: 7.4 g/dL (ref 6.5–8.1)

## 2020-12-21 LAB — RPR: RPR Ser Ql: NONREACTIVE

## 2020-12-21 LAB — I-STAT BETA HCG BLOOD, ED (MC, WL, AP ONLY): I-stat hCG, quantitative: <5 m[IU]/mL (ref <5)

## 2020-12-21 LAB — HIV ANTIBODY (ROUTINE TESTING W REFLEX): HIV Screen 4th Generation wRfx: NONREACTIVE

## 2020-12-21 MED ORDER — ONDANSETRON HCL 4 MG/2ML IJ SOLN
4.0000 mg | Freq: Once | INTRAMUSCULAR | Status: DC
  Filled 2020-12-21: qty 2

## 2020-12-21 MED ORDER — ONDANSETRON 4 MG PO TBDP
4.0000 mg | ORAL_TABLET | Freq: Once | ORAL | Status: DC
  Filled 2020-12-21: qty 1

## 2020-12-21 MED ORDER — ONDANSETRON 8 MG PO TBDP
8.0000 mg | ORAL_TABLET | Freq: Once | ORAL | Status: AC
  Administered 2020-12-21: 8 mg via ORAL
  Filled 2020-12-21: qty 1

## 2020-12-21 MED ORDER — LACTATED RINGERS IV BOLUS: 1000.0000 mL | Freq: Once | INTRAVENOUS | Status: DC

## 2020-12-21 MED ORDER — ONDANSETRON HCL 4 MG PO TABS: 4.0000 mg | ORAL_TABLET | Freq: Four times a day (QID) | ORAL | 0 refills | Status: DC | PRN

## 2020-12-21 NOTE — Telephone Encounter (Signed)
Open in error

## 2020-12-21 NOTE — Telephone Encounter (Signed)
Tried to call pt, LMOAM for return call.

## 2020-12-21 NOTE — Telephone Encounter (Signed)
[phone]  PATIENT HAS A QUESTION ABOUT THE ENEMA PART OF HER PREP

## 2020-12-21 NOTE — ED Notes (Addendum)
Pt transported to CT ?

## 2020-12-21 NOTE — ED Notes (Signed)
Pt now drinking second dose of oral contrast.

## 2020-12-21 NOTE — ED Notes (Signed)
Pt returned from CT °

## 2020-12-21 NOTE — ED Notes (Signed)
Pt ambulated to bathroom without difficulty.

## 2020-12-21 NOTE — Discharge Instructions (Addendum)
Take ibuprofen as needed for pain. If you need additional pain relief, add acetaminophen.  Return if your symptoms are getting worse.

## 2020-12-21 NOTE — Telephone Encounter (Signed)
Spoke to pt, questions answered.

## 2020-12-22 ENCOUNTER — Encounter (HOSPITAL_COMMUNITY): Payer: Self-pay

## 2020-12-24 ENCOUNTER — Other Ambulatory Visit: Payer: Self-pay

## 2020-12-24 ENCOUNTER — Other Ambulatory Visit (HOSPITAL_COMMUNITY)
Admission: RE | Admit: 2020-12-24 | Discharge: 2020-12-24 | Disposition: A | Discharge status: Home / Self Care | Payer: Medicaid Other | Source: Ambulatory Visit | Attending: Internal Medicine | Admitting: Internal Medicine

## 2020-12-24 DIAGNOSIS — R1032 Left lower quadrant pain: Secondary | ICD-10-CM | POA: Diagnosis present

## 2020-12-24 DIAGNOSIS — Z20822 Contact with and (suspected) exposure to covid-19: Secondary | ICD-10-CM | POA: Diagnosis not present

## 2020-12-24 DIAGNOSIS — Z79899 Other long term (current) drug therapy: Secondary | ICD-10-CM | POA: Diagnosis not present

## 2020-12-24 DIAGNOSIS — Z01812 Encounter for preprocedural laboratory examination: Secondary | ICD-10-CM | POA: Insufficient documentation

## 2020-12-24 DIAGNOSIS — D125 Benign neoplasm of sigmoid colon: Secondary | ICD-10-CM | POA: Diagnosis not present

## 2020-12-24 DIAGNOSIS — Z791 Long term (current) use of non-steroidal anti-inflammatories (NSAID): Secondary | ICD-10-CM | POA: Diagnosis not present

## 2020-12-24 DIAGNOSIS — Z793 Long term (current) use of hormonal contraceptives: Secondary | ICD-10-CM | POA: Diagnosis not present

## 2020-12-24 DIAGNOSIS — Z833 Family history of diabetes mellitus: Secondary | ICD-10-CM | POA: Diagnosis not present

## 2020-12-24 DIAGNOSIS — K648 Other hemorrhoids: Secondary | ICD-10-CM | POA: Diagnosis not present

## 2020-12-24 LAB — PREGNANCY, URINE: Preg Test, Ur: NEGATIVE

## 2020-12-24 LAB — SARS CORONAVIRUS 2 (TAT 6-24 HRS): SARS Coronavirus 2: NEGATIVE

## 2020-12-27 ENCOUNTER — Other Ambulatory Visit: Payer: Self-pay

## 2020-12-27 ENCOUNTER — Ambulatory Visit (HOSPITAL_COMMUNITY): Payer: Medicaid Other | Admitting: Anesthesiology

## 2020-12-27 ENCOUNTER — Ambulatory Visit (HOSPITAL_COMMUNITY)
Admission: RE | Admit: 2020-12-27 | Discharge: 2020-12-27 | Disposition: A | Discharge status: Home / Self Care | Payer: Medicaid Other | Attending: Internal Medicine | Admitting: Internal Medicine

## 2020-12-27 ENCOUNTER — Encounter (HOSPITAL_COMMUNITY)
Admission: RE | Disposition: A | Discharge status: Home / Self Care | Payer: Self-pay | Source: Home / Self Care | Attending: Internal Medicine

## 2020-12-27 ENCOUNTER — Encounter (HOSPITAL_COMMUNITY): Payer: Self-pay

## 2020-12-27 DIAGNOSIS — K648 Other hemorrhoids: Secondary | ICD-10-CM | POA: Diagnosis not present

## 2020-12-27 DIAGNOSIS — D125 Benign neoplasm of sigmoid colon: Secondary | ICD-10-CM | POA: Diagnosis not present

## 2020-12-27 DIAGNOSIS — K635 Polyp of colon: Secondary | ICD-10-CM

## 2020-12-27 DIAGNOSIS — Z791 Long term (current) use of non-steroidal anti-inflammatories (NSAID): Secondary | ICD-10-CM | POA: Insufficient documentation

## 2020-12-27 DIAGNOSIS — Z793 Long term (current) use of hormonal contraceptives: Secondary | ICD-10-CM | POA: Diagnosis not present

## 2020-12-27 DIAGNOSIS — R1032 Left lower quadrant pain: Secondary | ICD-10-CM | POA: Insufficient documentation

## 2020-12-27 DIAGNOSIS — Z79899 Other long term (current) drug therapy: Secondary | ICD-10-CM | POA: Insufficient documentation

## 2020-12-27 DIAGNOSIS — Z833 Family history of diabetes mellitus: Secondary | ICD-10-CM | POA: Insufficient documentation

## 2020-12-27 HISTORY — PX: COLONOSCOPY WITH PROPOFOL: SHX5780

## 2020-12-27 HISTORY — PX: POLYPECTOMY: SHX5525

## 2020-12-27 SURGERY — COLONOSCOPY WITH PROPOFOL
Anesthesia: General

## 2020-12-27 MED ORDER — PROPOFOL 500 MG/50ML IV EMUL
INTRAVENOUS | Status: DC | PRN
  Administered 2020-12-27: 150 ug/kg/min via INTRAVENOUS

## 2020-12-27 MED ORDER — PROPOFOL 10 MG/ML IV BOLUS
INTRAVENOUS | Status: DC | PRN
  Administered 2020-12-27: 100 mg via INTRAVENOUS

## 2020-12-27 MED ORDER — LACTATED RINGERS IV SOLN
INTRAVENOUS | Status: DC
  Administered 2020-12-27: 1000 mL via INTRAVENOUS

## 2020-12-27 NOTE — Transfer of Care (Signed)
Immediate Anesthesia Transfer of Care Note  Patient: Madeline Tucker  Procedure(s) Performed: COLONOSCOPY WITH PROPOFOL (N/A ) POLYPECTOMY  Patient Location: PACU  Anesthesia Type:General  Level of Consciousness: awake and alert   Airway & Oxygen Therapy: Patient Spontanous Breathing  Post-op Assessment: Report given to RN and Post -op Vital signs reviewed and stable  Post vital signs: Reviewed and stable  Last Vitals:  Vitals Value Taken Time  BP    Temp    Pulse    Resp    SpO2      Last Pain:  Vitals:   12/27/20 0845  TempSrc:   PainSc: 0-No pain      Patients Stated Pain Goal: 8 (00/16/42 9037)  Complications: No complications documented.

## 2020-12-27 NOTE — Discharge Instructions (Addendum)
Hemorrhoids Hemorrhoids are swollen veins in and around the rectum or anus. There are two types of hemorrhoids:  Internal hemorrhoids. These occur in the veins that are just inside the rectum. They may poke through to the outside and become irritated and painful.  External hemorrhoids. These occur in the veins that are outside the anus and can be felt as a painful swelling or hard lump near the anus. Most hemorrhoids do not cause serious problems, and they can be managed with home treatments such as diet and lifestyle changes. If home treatments do not help the symptoms, procedures can be done to shrink or remove the hemorrhoids. What are the causes? This condition is caused by increased pressure in the anal area. This pressure may result from various things, including:  Constipation.  Straining to have a bowel movement.  Diarrhea.  Pregnancy.  Obesity.  Sitting for long periods of time.  Heavy lifting or other activity that causes you to strain.  Anal sex.  Riding a bike for a long period of time. What are the signs or symptoms? Symptoms of this condition include:  Pain.  Anal itching or irritation.  Rectal bleeding.  Leakage of stool (feces).  Anal swelling.  One or more lumps around the anus. How is this diagnosed? This condition can often be diagnosed through a visual exam. Other exams or tests may also be done, such as:  An exam that involves feeling the rectal area with a gloved hand (digital rectal exam).  An exam of the anal canal that is done using a small tube (anoscope).  A blood test, if you have lost a significant amount of blood.  A test to look inside the colon using a flexible tube with a camera on the end (sigmoidoscopy or colonoscopy). How is this treated? This condition can usually be treated at home. However, various procedures may be done if dietary changes, lifestyle changes, and other home treatments do not help your symptoms. These  procedures can help make the hemorrhoids smaller or remove them completely. Some of these procedures involve surgery, and others do not. Common procedures include:  Rubber band ligation. Rubber bands are placed at the base of the hemorrhoids to cut off their blood supply.  Sclerotherapy. Medicine is injected into the hemorrhoids to shrink them.  Infrared coagulation. A type of light energy is used to get rid of the hemorrhoids.  Hemorrhoidectomy surgery. The hemorrhoids are surgically removed, and the veins that supply them are tied off.  Stapled hemorrhoidopexy surgery. The surgeon staples the base of the hemorrhoid to the rectal wall. Follow these instructions at home: Eating and drinking  Eat foods that have a lot of fiber in them, such as whole grains, beans, nuts, fruits, and vegetables.  Ask your health care provider about taking products that have added fiber (fiber supplements).  Reduce the amount of fat in your diet. You can do this by eating low-fat dairy products, eating less red meat, and avoiding processed foods.  Drink enough fluid to keep your urine pale yellow.   Managing pain and swelling  Take warm sitz baths for 20 minutes, 3-4 times a day to ease pain and discomfort. You may do this in a bathtub or using a portable sitz bath that fits over the toilet.  If directed, apply ice to the affected area. Using ice packs between sitz baths may be helpful. ? Put ice in a plastic bag. ? Place a towel between your skin and the bag. ? Leave  the ice on for 20 minutes, 2-3 times a day.   General instructions  Take over-the-counter and prescription medicines only as told by your health care provider.  Use medicated creams or suppositories as told.  Get regular exercise. Ask your health care provider how much and what kind of exercise is best for you. In general, you should do moderate exercise for at least 30 minutes on most days of the week (150 minutes each week). This can  include activities such as walking, biking, or yoga.  Go to the bathroom when you have the urge to have a bowel movement. Do not wait.  Avoid straining to have bowel movements.  Keep the anal area dry and clean. Use wet toilet paper or moist towelettes after a bowel movement.  Do not sit on the toilet for long periods of time. This increases blood pooling and pain.  Keep all follow-up visits as told by your health care provider. This is important. Contact a health care provider if you have:  Increasing pain and swelling that are not controlled by treatment or medicine.  Difficulty having a bowel movement, or you are unable to have a bowel movement.  Pain or inflammation outside the area of the hemorrhoids. Get help right away if you have:  Uncontrolled bleeding from your rectum. Summary  Hemorrhoids are swollen veins in and around the rectum or anus.  Most hemorrhoids can be managed with home treatments such as diet and lifestyle changes.  Taking warm sitz baths can help ease pain and discomfort.  In severe cases, procedures or surgery can be done to shrink or remove the hemorrhoids. This information is not intended to replace advice given to you by your health care provider. Make sure you discuss any questions you have with your health care provider. Document Revised: 03/28/2019 Document Reviewed: 03/21/2018 Elsevier Patient Education  [place]. Colon Polyps  Colon polyps are tissue growths inside the colon, which is part of the large intestine. They are one of the types of polyps that can grow in the body. A polyp may be a round bump or a mushroom-shaped growth. You could have one polyp or more than one. Most colon polyps are noncancerous (benign). However, some colon polyps can become cancerous over time. Finding and removing the polyps early can help prevent this. What are the causes? The exact cause of colon polyps is not known. What increases the risk? The  following factors may make you more likely to develop this condition:  Having a family history of colorectal cancer or colon polyps.  Being older than 44 years of age.  Being younger than 44 years of age and having a significant family history of colorectal cancer or colon polyps or a genetic condition that puts you at higher risk of getting colon polyps.  Having inflammatory bowel disease, such as ulcerative colitis or Crohn's disease.  Having certain conditions passed from parent to child (hereditary conditions), such as: ? Familial adenomatous polyposis (FAP). ? Lynch syndrome. ? Turcot syndrome. ? Peutz-Jeghers syndrome. ? MUTYH-associated polyposis (MAP).  Being overweight.  Certain lifestyle factors. These include smoking cigarettes, drinking too much alcohol, not getting enough exercise, and eating a diet that is high in fat and red meat and low in fiber.  Having had childhood cancer that was treated with radiation of the abdomen. What are the signs or symptoms? Many times, there are no symptoms. If you have symptoms, they may include:  Blood coming from the rectum during a  bowel movement.  Blood in the stool (feces). The blood may be bright red or very dark in color.  Pain in the abdomen.  A change in bowel habits, such as constipation or diarrhea. How is this diagnosed? This condition is diagnosed with a colonoscopy. This is a procedure in which a lighted, flexible scope is inserted into the opening between the buttocks (anus) and then passed into the colon to examine the area. Polyps are sometimes found when a colonoscopy is done as part of routine cancer screening tests. How is this treated? This condition is treated by removing any polyps that are found. Most polyps can be removed during a colonoscopy. Those polyps will then be tested for cancer. Additional treatment may be needed depending on the results of testing. Follow these instructions at home: Eating and  drinking  Eat foods that are high in fiber, such as fruits, vegetables, and whole grains.  Eat foods that are high in calcium and vitamin D, such as milk, cheese, yogurt, eggs, liver, fish, and broccoli.  Limit foods that are high in fat, such as fried foods and desserts.  Limit the amount of red meat, precooked or cured meat, or other processed meat that you eat, such as hot dogs, sausages, bacon, or meat loaves.  Limit sugary drinks.   Lifestyle  Maintain a healthy weight, or lose weight if recommended by your health care provider.  Exercise every day or as told by your health care provider.  Do not use any products that contain nicotine or tobacco, such as cigarettes, e-cigarettes, and chewing tobacco. If you need help quitting, ask your health care provider.  Do not drink alcohol if: ? Your health care provider tells you not to drink. ? You are pregnant, may be pregnant, or are planning to become pregnant.  If you drink alcohol: ? Limit how much you use to:  0-1 drink a day for women.  0-2 drinks a day for men. ? Know how much alcohol is in your drink. In the [place] one drink equals one 12 oz bottle of beer (355 mL), one 5 oz glass of wine (148 mL), or one 1 oz glass of hard liquor (44 mL). General instructions  Take over-the-counter and prescription medicines only as told by your health care provider.  Keep all follow-up visits. This is important. This includes having regularly scheduled colonoscopies. Talk to your health care provider about when you need a colonoscopy. Contact a health care provider if:  You have new or worsening bleeding during a bowel movement.  You have new or increased blood in your stool.  You have a change in bowel habits.  You lose weight for no known reason. Summary  Colon polyps are tissue growths inside the colon, which is part of the large intestine. They are one type of polyp that can grow in the body.  Most colon polyps are  noncancerous (benign), but some can become cancerous over time.  This condition is diagnosed with a colonoscopy.  This condition is treated by removing any polyps that are found. Most polyps can be removed during a colonoscopy. This information is not intended to replace advice given to you by your health care provider. Make sure you discuss any questions you have with your health care provider. Document Revised: 02/18/2020 Document Reviewed: 02/18/2020 Elsevier Patient Education  2021 [employer].  Colonoscopy Discharge Instructions  Read the instructions outlined below and refer to this sheet in the next few weeks. These discharge instructions provide  you with general information on caring for yourself after you leave the hospital. Your doctor may also give you specific instructions. While your treatment has been planned according to the most current medical practices available, unavoidable complications occasionally occur.   ACTIVITY  You may resume your regular activity, but move at a slower pace for the next 24 hours.   Take frequent rest periods for the next 24 hours.   Walking will help get rid of the air and reduce the bloated feeling in your belly (abdomen).   No driving for 24 hours (because of the medicine (anesthesia) used during the test).    Do not sign any important legal documents or operate any machinery for 24 hours (because of the anesthesia used during the test).  NUTRITION  Drink plenty of fluids.   You may resume your normal diet as instructed by your doctor.   Begin with a light meal and progress to your normal diet. Heavy or fried foods are harder to digest and may make you feel sick to your stomach (nauseated).   Avoid alcoholic beverages for 24 hours or as instructed.  MEDICATIONS  You may resume your normal medications unless your doctor tells you otherwise.  WHAT YOU CAN EXPECT TODAY  Some feelings of bloating in the abdomen.   Passage of more  gas than usual.   Spotting of blood in your stool or on the toilet paper.  IF YOU HAD POLYPS REMOVED DURING THE COLONOSCOPY:  No aspirin products for 7 days or as instructed.   No alcohol for 7 days or as instructed.   Eat a soft diet for the next 24 hours.  FINDING OUT THE RESULTS OF YOUR TEST Not all test results are available during your visit. If your test results are not back during the visit, make an appointment with your caregiver to find out the results. Do not assume everything is normal if you have not heard from your caregiver or the medical facility. It is important for you to follow up on all of your test results.  SEEK IMMEDIATE MEDICAL ATTENTION IF:  You have more than a spotting of blood in your stool.   Your belly is swollen (abdominal distention).   You are nauseated or vomiting.   You have a temperature over 101.   You have abdominal pain or discomfort that is severe or gets worse throughout the day.   Your colonoscopy revealed 1 polyp(s) which I removed successfully. Await pathology results, my office will contact you. I recommend repeating colonoscopy in 5 years for surveillance purposes.  I did not find any evidence of underlying inflammatory bowel disease such as ulcerative colitis or Crohn's disease to explain your abdominal pain.  Continue current medications.  Follow-up with GI clinic in 2 to 3 months.   I hope you have a great rest of your week!  Elon Alas. Abbey Chatters, D.O. Gastroenterology and Hepatology [facility]

## 2020-12-27 NOTE — H&P (Addendum)
Primary Care Physician:  Health, [facility] Primary Gastroenterologist:  Dr. Abbey Chatters  Pre-Procedure History & Physical: HPI:  Madeline Tucker is a 45 y.o. female is herefor a colonoscopy to be performed for LLQ abdominal pain. Patient states her abdominal pain is primarily right lower quadrant radiates to the left lower quadrant as well.  States this is constant pain, 5 out of 10.  Notes abdominal tenderness as well.  Denies any change in her bowel habits.  No constipation or diarrhea.  No melena hematochezia.  No unintentional weight loss.  No previous colonoscopy, denies any family history of colorectal malignancy.  Denies any upper GI symptoms including reflux, heartburn, dysphagia/odynophagia, chest pain.  Past Medical History:  Diagnosis Date  . Anxiety   . Mental disorder     History reviewed. No pertinent surgical history.  Prior to Admission medications   Medication Sig Start Date End Date Taking? Authorizing Provider  ibuprofen (ADVIL) 200 MG tablet Take 200 mg by mouth every 6 (six) hours as needed for mild pain or moderate pain.   Yes [provider]  medroxyPROGESTERone (PROVERA) 2.5 MG tablet Take 1 tablet (2.5 mg total) by mouth daily. 11/22/20  Yes Florian Buff, MD  ondansetron (ZOFRAN) 4 MG tablet Take 1 tablet (4 mg total) by mouth every 6 (six) hours as needed for nausea or vomiting. 07/15/41  Yes Delora Fuel, MD  chlorhexidine (PERIDEX) 0.12 % solution 15 mLs by Mouth Rinse route 2 (two) times daily. 12/14/20   [provider]    Allergies as of 11/25/2020  . (No Known Allergies)    Family History  Problem Relation Age of Onset  . Cancer Mother   . Diabetes Mother     Social History   Socioeconomic History  . Marital status: Single    Spouse name: Not on file  . Number of children: Not on file  . Years of education: Not on file  . Highest education level: Not on file  Occupational History  . Not on file  Tobacco Use  . Smoking  status: Never Smoker  . Smokeless tobacco: Never Used  Vaping Use  . Vaping Use: Never used  Substance and Sexual Activity  . Alcohol use: Never  . Drug use: Never  . Sexual activity: Never    Birth control/protection: None  Other Topics Concern  . Not on file  Social History Narrative  . Not on file   Social Determinants of Health   Financial Resource Strain: Low Risk   . Difficulty of Paying Living Expenses: Not hard at all  Food Insecurity: No Food Insecurity  . Worried About Charity fundraiser in the Last Year: Never true  . Ran Out of Food in the Last Year: Never true  Transportation Needs: No Transportation Needs  . Lack of Transportation (Medical): No  . Lack of Transportation (Non-Medical): No  Physical Activity: Insufficiently Active  . Days of Exercise per Week: 2 days  . Minutes of Exercise per Session: 10 min  Stress: No Stress Concern Present  . Feeling of Stress : Only a little  Social Connections: Socially Isolated  . Frequency of Communication with Friends and Family: Never  . Frequency of Social Gatherings with Friends and Family: Never  . Attends Religious Services: Never  . Active Member of Clubs or Organizations: Yes  . Attends Archivist Meetings: Never  . Marital Status: Never married  Intimate Partner Violence: Not At Risk  . Fear of Current  or Ex-Partner: No  . Emotionally Abused: No  . Physically Abused: No  . Sexually Abused: No    Review of Systems: See HPI, otherwise negative ROS  Physical Exam: General:   Alert,  Well-developed, well-nourished, pleasant and cooperative in NAD Head:  Normocephalic and atraumatic. Eyes:  Sclera clear, no icterus.   Conjunctiva pink. Ears:  Normal auditory acuity. Nose:  No deformity, discharge,  or lesions. Mouth:  No deformity or lesions, dentition normal. Neck:  Supple; no masses or thyromegaly. Lungs:  Clear throughout to auscultation.   No wheezes, crackles, or rhonchi. No acute  distress. Heart:  Regular rate and rhythm; no murmurs, clicks, rubs,  or gallops. Abdomen:  Soft, nontender and nondistended. No masses, hepatosplenomegaly or hernias noted. Normal bowel sounds, without guarding, and without rebound.   Msk:  Symmetrical without gross deformities. Normal posture. Pulses:  Normal pulses noted. Extremities:  Without clubbing or edema. Neurologic:  Alert and  oriented x4;  grossly normal neurologically. Skin:  Intact without significant lesions or rashes. Cervical Nodes:  No significant cervical adenopathy. Psych:  Alert and cooperative. Normal mood and affect.   Impression/Plan: Madeline Tucker is here for a colonoscopy to be performed for LLQ abdominal pain.   The risks of the procedure including infection, bleed, or perforation as well as benefits, limitations, alternatives and imponderables have been reviewed with the patient. Questions have been answered. All parties agreeable.

## 2020-12-27 NOTE — Op Note (Signed)
[facility] Patient Name: Madeline Tucker Procedure Date: 12/27/2020 8:36 AM MRN: [id number] Date of Birth: [birth year] Attending MD: Elon Alas. Abbey Chatters DO CSN: [id number] Age: 45 Admit Type: Outpatient Procedure:                Colonoscopy Indications:              Abdominal pain in the left lower quadrant Providers:                Elon Alas. Abbey Chatters, DO, Gwenlyn Fudge, RN, Aram Candela Referring MD:              Medicines:                See the Anesthesia note for documentation of the                            administered medications Complications:            No immediate complications. Estimated Blood Loss:     Estimated blood loss was minimal. Procedure:                Pre-Anesthesia Assessment:                           - The anesthesia plan was to use monitored                            anesthesia care (MAC).                           After obtaining informed consent, the colonoscope                            was passed under direct vision. Throughout the                            procedure, the patient's blood pressure, pulse, and                            oxygen saturations were monitored continuously. The                            [id number]-[id number] ([id number]) scope was introduced through                            the anus and advanced to the the terminal ileum,                            with identification of the appendiceal orifice and                            IC valve. The colonoscopy was performed without                            difficulty. The patient tolerated  the procedure                            well. The quality of the bowel preparation was                            evaluated using the BBPS [facility] Bowel Preparation                            Scale) with scores of: Right Colon = 2 (minor                            amount of residual staining, small fragments of                            stool and/or opaque liquid, but mucosa  seen well),                            Transverse Colon = 2 (minor amount of residual                            staining, small fragments of stool and/or opaque                            liquid, but mucosa seen well) and Left Colon = 3                            (entire mucosa seen well with no residual staining,                            small fragments of stool or opaque liquid). The                            total BBPS score equals 7. The quality of the bowel                            preparation was fair. Scope In: 8:50:13 AM Scope Out: 9:03:24 AM Scope Withdrawal Time: 0 hours 10 minutes 31 seconds  Total Procedure Duration: 0 hours 13 minutes 11 seconds  Findings:      The perianal and digital rectal examinations were normal.      Non-bleeding internal hemorrhoids were found during endoscopy.      A 2 mm polyp was found in the sigmoid colon. The polyp was sessile. The       polyp was removed with a cold biopsy forceps. Resection and retrieval       were complete.      The terminal ileum appeared normal. Impression:               - Preparation of the colon was fair.                           - Non-bleeding internal hemorrhoids.                           -  One 2 mm polyp in the sigmoid colon, removed with                            a cold biopsy forceps. Resected and retrieved.                           - The examined portion of the ileum was normal. Moderate Sedation:      Per Anesthesia Care Recommendation:           - Patient has a contact number available for                            emergencies. The signs and symptoms of potential                            delayed complications were discussed with the                            patient. Return to normal activities tomorrow.                            Written discharge instructions were provided to the                            patient.                           - Resume previous diet.                           -  Continue present medications.                           - Await pathology results.                           - Repeat colonoscopy in 5 years for surveillance                            and borderline colon prep.                           - Return to GI clinic in 3 months. Procedure Code(s):        --- Professional ---                           [phone], Colonoscopy, flexible; with biopsy, single                            or multiple Diagnosis Code(s):        --- Professional ---                           K64.8, Other hemorrhoids  K63.5, Polyp of colon                           R10.32, Left lower quadrant pain CPT copyright 2019 American Medical Association. All rights reserved. The codes documented in this report are preliminary and upon coder review may  be revised to meet current compliance requirements. Elon Alas. Abbey Chatters, DO [place] Abbey Chatters, DO 12/27/2020 9:05:38 AM This report has been signed electronically. Number of Addenda: 0

## 2020-12-27 NOTE — Anesthesia Preprocedure Evaluation (Addendum)
Anesthesia Evaluation  Patient identified by MRN, date of birth, ID band Patient awake    Reviewed: Allergy & Precautions, NPO status , Patient's Chart, lab work & pertinent test results  History of Anesthesia Complications Negative for: history of anesthetic complications  Airway Mallampati: II  TM Distance: >3 FB Neck ROM: Full    Dental  (+) Dental Advisory Given, Missing, Chipped   Pulmonary neg pulmonary ROS,    Pulmonary exam normal breath sounds clear to auscultation       Cardiovascular negative cardio ROS Normal cardiovascular exam Rhythm:Regular Rate:Normal     Neuro/Psych PSYCHIATRIC DISORDERS Anxiety negative neurological ROS     GI/Hepatic negative GI ROS, Neg liver ROS,   Endo/Other  negative endocrine ROS  Renal/GU negative Renal ROS  negative genitourinary   Musculoskeletal negative musculoskeletal ROS (+)   Abdominal   Peds negative pediatric ROS (+)  Hematology negative hematology ROS (+)   Anesthesia Other Findings   Reproductive/Obstetrics negative OB ROS                            Anesthesia Physical Anesthesia Plan  ASA: II  Anesthesia Plan: General   Post-op Pain Management:    Induction: Intravenous  PONV Risk Score and Plan: TIVA  Airway Management Planned: Nasal Cannula and Natural Airway  Additional Equipment:   Intra-op Plan:   Post-operative Plan:   Informed Consent: I have reviewed the patients History and Physical, chart, labs and discussed the procedure including the risks, benefits and alternatives for the proposed anesthesia with the patient or authorized representative who has indicated his/her understanding and acceptance.     Dental advisory given  Plan Discussed with: CRNA and Surgeon  Anesthesia Plan Comments:         Anesthesia Quick Evaluation

## 2020-12-27 NOTE — Anesthesia Postprocedure Evaluation (Signed)
Anesthesia Post Note  Patient: Madeline Tucker  Procedure(s) Performed: COLONOSCOPY WITH PROPOFOL (N/A ) POLYPECTOMY  Patient location during evaluation: Phase II Anesthesia Type: General Level of consciousness: awake and oriented Pain management: satisfactory to patient Vital Signs Assessment: post-procedure vital signs reviewed and stable Respiratory status: spontaneous breathing and respiratory function stable Cardiovascular status: blood pressure returned to baseline and stable Postop Assessment: no apparent nausea or vomiting Anesthetic complications: no   No complications documented.   Last Vitals:  Vitals:   12/27/20 0729  BP: 116/77  Pulse: (!) 110  Resp: (!) 24  Temp: 36.7 C  SpO2: 100%    Last Pain:  Vitals:   12/27/20 0845  TempSrc:   PainSc: 0-No pain                 Karna Dupes

## 2020-12-28 ENCOUNTER — Encounter: Payer: Self-pay | Admitting: Obstetrics & Gynecology

## 2020-12-28 ENCOUNTER — Other Ambulatory Visit (HOSPITAL_COMMUNITY)
Admission: RE | Admit: 2020-12-28 | Discharge: 2020-12-28 | Disposition: A | Discharge status: Home / Self Care | Payer: Medicaid Other | Source: Ambulatory Visit | Attending: Obstetrics & Gynecology | Admitting: Obstetrics & Gynecology

## 2020-12-28 ENCOUNTER — Ambulatory Visit (INDEPENDENT_AMBULATORY_CARE_PROVIDER_SITE_OTHER): Payer: Medicaid Other | Admitting: Obstetrics & Gynecology

## 2020-12-28 ENCOUNTER — Other Ambulatory Visit: Payer: Self-pay

## 2020-12-28 VITALS — BP 134/78 | HR 86 | Wt 222.0 lb

## 2020-12-28 DIAGNOSIS — N938 Other specified abnormal uterine and vaginal bleeding: Secondary | ICD-10-CM

## 2020-12-28 DIAGNOSIS — Z124 Encounter for screening for malignant neoplasm of cervix: Secondary | ICD-10-CM | POA: Insufficient documentation

## 2020-12-28 MED ORDER — MEGESTROL ACETATE 40 MG PO TABS
ORAL_TABLET | ORAL | 3 refills | Status: DC
Start: 2020-12-28 — End: 2021-02-28

## 2020-12-28 NOTE — Progress Notes (Signed)
Follow up appointment for results  Chief Complaint  Patient presents with  . Follow-up    Seen in ED recently told to follow up with gyn    Blood pressure 134/78, pulse 86, weight 222 lb (100.7 kg), last menstrual period 12/20/2020.  No results found.  General WDWN female NAD Vulva:  normal appearing vulva with no masses, tenderness or lesions Vagina:  normal mucosa, no discharge Cervix:  Normal no lesions Pap done Uterus:  normal size, contour, position, consistency, mobility, non-tender Adnexa: ovaries:present,  normal adnexa in size, nontender and no masses   MEDS ordered this encounter: Meds ordered this encounter  Medications  . megestrol (MEGACE) 40 MG tablet    Sig: 3 tablets a day for 5 days, 2 tablets a day for 5 days then 1 tablet daily    Dispense:  45 tablet    Refill:  3    Orders for this encounter: No orders of the defined types were placed in this encounter.   Impression:   ICD-10-CM   1. DUB (dysfunctional uterine bleeding)  N93.8    12+ years of amenorrhea, bled in December and I put her on provera 2.5 mg daily to suppress the endometrium, still bleeding  2. Routine cervical smear  Z12.4 Cytology - PAP( Pray)      Plan:   Follow Up: Return in about 6 weeks (around 02/08/2021) for Follow up, with Dr Elonda Husky.      All questions were answered.  Past Medical History:  Diagnosis Date  . Anxiety   . Mental disorder     Past Surgical History:  Procedure Laterality Date  . COLONOSCOPY WITH PROPOFOL N/A 12/27/2020   Procedure: COLONOSCOPY WITH PROPOFOL;  Surgeon: [name], DO;  [facility]: [facility];  Service: Endoscopy;  Laterality: N/A;  8:30am  . POLYPECTOMY  12/27/2020   Procedure: POLYPECTOMY;  Surgeon: [name], DO;  Location: [facility];  Service: Endoscopy;;    OB History    Gravida  0   Para  0   Term  0   Preterm  0   AB  0   Living  0     SAB  0   IAB  0   Ectopic  0   Multiple  0    Live Births  0           No Known Allergies  Social History   Socioeconomic History  . Marital status: Single    Spouse name: Not on file  . Number of children: Not on file  . Years of education: Not on file  . Highest education level: Not on file  Occupational History  . Not on file  Tobacco Use  . Smoking status: Never Smoker  . Smokeless tobacco: Never Used  Vaping Use  . Vaping Use: Never used  Substance and Sexual Activity  . Alcohol use: Never  . Drug use: Never  . Sexual activity: Never    Birth control/protection: None  Other Topics Concern  . Not on file  Social History Narrative  . Not on file   Social Determinants of Health   Financial Resource Strain: Low Risk   . Difficulty of Paying Living Expenses: Not hard at all  Food Insecurity: No Food Insecurity  . Worried About Charity fundraiser in the Last Year: Never true  . Ran Out of Food in the Last Year: Never true  Transportation Needs: No Transportation Needs  . Lack of  Transportation (Medical): No  . Lack of Transportation (Non-Medical): No  Physical Activity: Insufficiently Active  . Days of Exercise per Week: 2 days  . Minutes of Exercise per Session: 10 min  Stress: No Stress Concern Present  . Feeling of Stress : Only a little  Social Connections: Socially Isolated  . Frequency of Communication with Friends and Family: Never  . Frequency of Social Gatherings with Friends and Family: Never  . Attends Religious Services: Never  . Active Member of Clubs or Organizations: Yes  . Attends Archivist Meetings: Never  . Marital Status: Never married    Family History  Problem Relation Age of Onset  . Cancer Mother   . Diabetes Mother

## 2020-12-29 LAB — SURGICAL PATHOLOGY

## 2020-12-30 ENCOUNTER — Encounter (HOSPITAL_COMMUNITY): Payer: Self-pay | Admitting: Internal Medicine

## 2020-12-30 LAB — CYTOLOGY - PAP
Adequacy: ABSENT
Chlamydia: NEGATIVE
Comment: NEGATIVE
Comment: NEGATIVE
Comment: NORMAL
Diagnosis: NEGATIVE
High risk HPV: NEGATIVE
Neisseria Gonorrhea: NEGATIVE

## 2021-01-06 NOTE — Progress Notes (Signed)
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 You will need to repeat your colonoscopy in 5 years as previously discussed postoperatively. Follow-up with GI as needed.      Thank you, Floria Raveling, CMA

## 2021-02-08 ENCOUNTER — Ambulatory Visit (INDEPENDENT_AMBULATORY_CARE_PROVIDER_SITE_OTHER): Payer: Medicaid Other | Admitting: Obstetrics & Gynecology

## 2021-02-08 ENCOUNTER — Encounter: Payer: Self-pay | Admitting: Obstetrics & Gynecology

## 2021-02-08 ENCOUNTER — Other Ambulatory Visit: Payer: Self-pay

## 2021-02-08 VITALS — BP 132/70 | HR 84 | Ht 65.0 in | Wt 219.0 lb

## 2021-02-08 DIAGNOSIS — N946 Dysmenorrhea, unspecified: Secondary | ICD-10-CM | POA: Diagnosis not present

## 2021-02-08 DIAGNOSIS — N938 Other specified abnormal uterine and vaginal bleeding: Secondary | ICD-10-CM

## 2021-02-08 MED ORDER — MYFEMBREE 40-1-0.5 MG PO TABS: 1.0000 | ORAL_TABLET | Freq: Every day | ORAL | 3 refills | Status: DC

## 2021-02-08 NOTE — Progress Notes (Signed)
Chief Complaint  Patient presents with  . Follow-up    On DUB      45 y.o. G0P0000 No LMP recorded. The current method of family planning is oral progesterone-only contraceptive.  Outpatient Encounter Medications as of 02/08/2021  Medication Sig  . ibuprofen (ADVIL) 200 MG tablet Take 200 mg by mouth every 6 (six) hours as needed for mild pain or moderate pain.  . megestrol (MEGACE) 40 MG tablet 3 tablets a day for 5 days, 2 tablets a day for 5 days then 1 tablet daily  . ondansetron (ZOFRAN) 4 MG tablet Take 1 tablet (4 mg total) by mouth every 6 (six) hours as needed for nausea or vomiting.  . Relugolix-Estradiol-Norethind (MYFEMBREE) 40-1-0.5 MG TABS Take 1 tablet by mouth daily.  . chlorhexidine (PERIDEX) 0.12 % solution 15 mLs by Mouth Rinse route 2 (two) times daily. (Patient not taking: Reported on 02/08/2021)  . medroxyPROGESTERone (PROVERA) 2.5 MG tablet Take 1 tablet (2.5 mg total) by mouth daily. (Patient not taking: Reported on 02/08/2021)   No facility-administered encounter medications on file as of 02/08/2021.    Subjective Madeline Tucker is seen back in follow-up To recap the patient went 12 years without a.  And bleeding and has been difficult to stop since then CT scan reveals normal pelvic anatomy and that is reviewed today I reviewed my previous note which reveals a normal pelvic exam Her Pap smear was also normal  The patient states she goes 1 or 2 days without bleeding but then has variable amounts of bleeding other days she probably bleeds 5 out of 7 days a week Sometimes spotting sometimes brown sometimes heavier red with clots Her cramping is better but certainly she has more of it when she is having heavier bleeding She has been taking the megestrol as prescribed Now on daily regimen She also bled significantly on the daily Provera   Past Medical History:  Diagnosis Date  . Anxiety   . Mental disorder     Past Surgical History:  Procedure  Laterality Date  . COLONOSCOPY WITH PROPOFOL N/A 12/27/2020   Procedure: COLONOSCOPY WITH PROPOFOL;  Surgeon: [name], DO;  [facility];  Service: Endoscopy;  Laterality: N/A;  8:30am  . POLYPECTOMY  12/27/2020   Procedure: POLYPECTOMY;  Surgeon: [name], DO;  [facility];  Service: Endoscopy;;    OB History    Gravida  0   Para  0   Term  0   Preterm  0   AB  0   Living  0     SAB  0   IAB  0   Ectopic  0   Multiple  0   Live Births  0           No Known Allergies  Social History   Socioeconomic History  . Marital status: Single    Spouse name: Not on file  . Number of children: Not on file  . Years of education: Not on file  . Highest education level: Not on file  Occupational History  . Not on file  Tobacco Use  . Smoking status: Never Smoker  . Smokeless tobacco: Never Used  Vaping Use  . Vaping Use: Never used  Substance and Sexual Activity  . Alcohol use: Never  . Drug use: Never  . Sexual activity: Never    Birth control/protection: None  Other Topics Concern  . Not on file  Social History Narrative  . Not on file   Social Determinants of Health   Financial Resource Strain: Low Risk   . Difficulty of Paying Living Expenses: Not hard at all  Food Insecurity: No Food Insecurity  . Worried About Charity fundraiser in the Last Year: Never true  . Ran Out of Food in the Last Year: Never true  Transportation Needs: No Transportation Needs  . Lack of Transportation (Medical): No  . Lack of Transportation (Non-Medical): No  Physical Activity: Insufficiently Active  . Days of Exercise per Week: 2 days  . Minutes of Exercise per Session: 10 min  Stress: No Stress Concern Present  . Feeling of Stress : Only a little  Social Connections: Socially Isolated  . Frequency of Communication with Friends and Family: Never  . Frequency of Social Gatherings with Friends and Family: Never  . Attends Religious  Services: Never  . Active Member of Clubs or Organizations: Yes  . Attends Archivist Meetings: Never  . Marital Status: Never married    Family History  Problem Relation Age of Onset  . Cancer Mother   . Diabetes Mother     Medications:       Current Outpatient Medications:  .  ibuprofen (ADVIL) 200 MG tablet, Take 200 mg by mouth every 6 (six) hours as needed for mild pain or moderate pain., Disp: , Rfl:  .  megestrol (MEGACE) 40 MG tablet, 3 tablets a day for 5 days, 2 tablets a day for 5 days then 1 tablet daily, Disp: 45 tablet, Rfl: 3 .  ondansetron (ZOFRAN) 4 MG tablet, Take 1 tablet (4 mg total) by mouth every 6 (six) hours as needed for nausea or vomiting., Disp: 20 tablet, Rfl: 0 .  Relugolix-Estradiol-Norethind (MYFEMBREE) 40-1-0.5 MG TABS, Take 1 tablet by mouth daily., Disp: 30 tablet, Rfl: 3 .  chlorhexidine (PERIDEX) 0.12 % solution, 15 mLs by Mouth Rinse route 2 (two) times daily. (Patient not taking: Reported on 02/08/2021), Disp: , Rfl:  .  medroxyPROGESTERone (PROVERA) 2.5 MG tablet, Take 1 tablet (2.5 mg total) by mouth daily. (Patient not taking: Reported on 02/08/2021), Disp: 30 tablet, Rfl: 11  Objective Blood pressure 132/70, pulse 84, height 5\' 5"  (1.651 m), weight 219 lb (99.3 kg).  Gen WDWN NAD  Pertinent ROS No burning with urination, frequency or urgency No nausea, vomiting or diarrhea Nor fever chills or other constitutional symptoms   Labs or studies reviewed    Impression Diagnoses this Encounter::   ICD-10-CM   1. DUB (dysfunctional uterine bleeding): Chronic + stable  N93.8    Have been unable to stop her bleeding with megestrol.  CT reveals normal pelvic anatomy. Will try myfembree as a combination solution for her DUB  2. Dysmenorrhea: chronic + stable  N94.6     Established relevant diagnosis(es):   Plan/Recommendations: Meds ordered this encounter  Medications  . Relugolix-Estradiol-Norethind (MYFEMBREE) 40-1-0.5 MG  TABS    Sig: Take 1 tablet by mouth daily.    Dispense:  30 tablet    Refill:  3    Labs or Scans Ordered: No orders of the defined types were placed in this encounter.   Management:: Myfembree trial  Follow up Return in about 3 months (around 05/11/2021) for Follow up, with Dr Elonda Husky.       All questions were answered.

## 2021-02-26 NOTE — Progress Notes (Signed)
Referring Provider: Health, Andre Lefort* Primary Care Physician:  Health, [place] Primary GI Physician: Dr. Abbey Chatters  Chief Complaint  Patient presents with  . Abdominal Pain    Discomfort right lower abd    HPI:   Madeline Tucker is a 45 y.o. female presenting today for follow-up of abdominal pain.   Patient was last seen in our office 11/25/2020 at the time of initial consult.  She reported primarily right lower quadrant abdominal pain radiating to left lower quadrant that was constant about 5 out of 10 in severity.  Denied change in bowel habits, constipation, or diarrhea.  Denies melena, hematochezia, unintentional weight loss.  No significant upper GI symptoms.  She had ultrasound 09/06/2020 which showed normal gallbladder, questionable subtle masses up to 15 mm in the liver.  Subsequent MRI abdomen shows normal-appearing liver.  She was also evaluated by gynecology and had a pelvic ultrasound which was within normal limits.  Plan to proceed with colonoscopy.  Colonoscopy 12/27/2020: Nonbleeding internal hemorrhoids, 2 mm polyp in sigmoid colon resected and retrieved (tubular adenoma).  Recommended repeat colonoscopy in 5 years.  Notably, she also had a CT of her abdomen and pelvis without contrast on 12/21/2020 with questionable enlargement of the lower uterine segment/cervix.  Physiologic ovarian cyst suspected.  Recommended correlation with pelvic exam to exclude cervical mass.  She has follow-up with OB/GYN 12/28/2020 with normal pelvic exam.  Today:  Right sided abdominal pain continues. Off and on. Most every day. Taking ibuprofen as needed.  Quality is dull.  Pain is in the mid right abdomen.  No association with meals. Reports early satiety. This started in October when right sided pain started. Comes on before a BM at times with improvement after a BM.  She had postprandial nausea and vomiting without hematemesis in February/March, but this has resolved.  She is  no longer taking Zofran.  When asking about heartburn, patient reported she did have the symptoms.  Upon further discussion, seems that she is actually having chest pain.  Reports symptoms occur when walking up stairs or cleaning her house.  Associated shortness of breath.  She has to sit down and rest and symptoms resolved within a few seconds to minutes.  Denies palpitations.  No chest pain or shortness of breath at rest.   No diarrhea or constipation. Normal soft, formed BMs daily. No brbpr or melena.   Continues with vaginal bleeding and clotting.    Due to mental disorder, it was difficult to get a clear history from patient.   Past Medical History:  Diagnosis Date  . Anxiety   . Mental disorder     Past Surgical History:  Procedure Laterality Date  . COLONOSCOPY WITH PROPOFOL N/A 12/27/2020    Surgeon: Hurshel Keys K, DO;  Nonbleeding internal hemorrhoids, 2 mm polyp in sigmoid colon resected and retrieved (tubular adenoma).  Recommended repeat colonoscopy in 5 years.  Marland Kitchen POLYPECTOMY  12/27/2020   Procedure: POLYPECTOMY;  Surgeon: [name], DO;  [facility];  Service: Endoscopy;;    Current Outpatient Medications  Medication Sig Dispense Refill  . chlorhexidine (PERIDEX) 0.12 % solution 15 mLs by Mouth Rinse route 2 (two) times daily.    Marland Kitchen dicyclomine (BENTYL) 10 MG capsule Take 1 capsule (10 mg total) as needed for abdominal pain.  Start with 1 capsule in the morning and increase up to 3 times daily as needed.  Hold in the setting of constipation. 90 capsule 1  .  ibuprofen (ADVIL) 200 MG tablet Take 200 mg by mouth every 6 (six) hours as needed for mild pain or moderate pain.    Marland Kitchen omeprazole (PRILOSEC) 20 MG capsule Take 1 capsule (20 mg total) by mouth daily before breakfast. 30 capsule 3  . Relugolix-Estradiol-Norethind (MYFEMBREE) 40-1-0.5 MG TABS Take 1 tablet by mouth daily. 30 tablet 3  . ondansetron (ZOFRAN) 4 MG tablet Take 1 tablet (4 mg total) by  mouth every 6 (six) hours as needed for nausea or vomiting. (Patient not taking: Reported on 02/28/2021) 20 tablet 0   No current facility-administered medications for this visit.    Allergies as of 02/28/2021  . (No Known Allergies)    Family History  Problem Relation Age of Onset  . Heart attack Father   . Cancer Mother        doesn't know what type.   . Diabetes Mother   . Stroke Mother     Social History   Socioeconomic History  . Marital status: Single    Spouse name: Not on file  . Number of children: Not on file  . Years of education: Not on file  . Highest education level: Not on file  Occupational History  . Not on file  Tobacco Use  . Smoking status: Never Smoker  . Smokeless tobacco: Never Used  Vaping Use  . Vaping Use: Never used  Substance and Sexual Activity  . Alcohol use: Never  . Drug use: Never  . Sexual activity: Never    Birth control/protection: None  Other Topics Concern  . Not on file  Social History Narrative  . Not on file   Social Determinants of Health   Financial Resource Strain: Low Risk   . Difficulty of Paying Living Expenses: Not hard at all  Food Insecurity: No Food Insecurity  . Worried About Charity fundraiser in the Last Year: Never true  . Ran Out of Food in the Last Year: Never true  Transportation Needs: No Transportation Needs  . Lack of Transportation (Medical): No  . Lack of Transportation (Non-Medical): No  Physical Activity: Insufficiently Active  . Days of Exercise per Week: 2 days  . Minutes of Exercise per Session: 10 min  Stress: No Stress Concern Present  . Feeling of Stress : Only a little  Social Connections: Socially Isolated  . Frequency of Communication with Friends and Family: Never  . Frequency of Social Gatherings with Friends and Family: Never  . Attends Religious Services: Never  . Active Member of Clubs or Organizations: Yes  . Attends Archivist Meetings: Never  . Marital Status:  Never married    Review of Systems: Gen: Denies fever, chills, cold or flulike symptoms, presyncope, syncope. CV: See HPI. Resp: See HPI. GI: See HPI Heme: See HPI  Physical Exam: BP 132/68   Pulse (!) 105   Temp (!) 96.9 F (36.1 C) (Temporal)   Ht 5\' 5"  (1.651 m)   Wt 216 lb 12.8 oz (98.3 kg)   LMP 12/27/2020 (Approximate) Comment: "still bleeding" 02/28/21  BMI 36.08 kg/m  General:   Alert and oriented. No distress noted. Pleasant and cooperative.  Head:  Normocephalic and atraumatic. Eyes:  Conjuctiva clear without scleral icterus. Heart:  S1, S2 present without murmurs appreciated. Lungs:  Clear to auscultation bilaterally. No wheezes, rales, or rhonchi. No distress.  Abdomen:  +BS, soft, non-tender and non-distended. No rebound or guarding. No HSM or masses noted. Msk:  Symmetrical without gross deformities. Normal  posture. Extremities:  Without edema. Neurologic:  Alert and oriented x4 Psych:  Flat affect.   Assessment:  45 year old female presenting today for follow-up of right-sided mid abdominal pain.  Etiology is unclear, and due to history of mental disorder, it is very difficult to get a detailed history.  Denies any association with meals though she does report early satiety.  Also reports postprandial nausea/vomiting in February/March that has now resolved.  Initially when asking about heartburn, patient stated yes.  Upon further discussion, it seems that she is having chest pain and associated shortness of breath with activity that improves with rest.  Denies constipation or diarrhea, but does tell me that sometimes, her abdominal pain is prior to bowel movements and improves thereafter.  Prior evaluation has included colonoscopy in February 2022 revealing nonbleeding internal hemorrhoids and 2 mm tubular adenoma with recommendations to repeat colonoscopy in 5 years.  Abdominal ultrasound in October 2021 with normal-appearing gallbladder without biliary dilation.   Question of subtle liver masses up to 15 mm in diameter, but follow-up MRI with no hepatic masses identified.  CT A/P without contrast 12/21/2020 with questionable enlargement of lower uterine segment/cervix, physiologic bilateral ovarian cysts suspected, otherwise no significant findings.  She had follow-up with OB/GYN 12/28/2020 with normal pelvic exam.  Notably, she continues with abnormal vaginal bleeding.  Etiology of abdominal pain is not clear.  Considered EGD to rule out gastritis, duodenitis, PUD.  However, due to reported chest pain, we will hold off on this.  I will empirically start her on omeprazole 20 mg daily.  Additionally, query whether she may have some IBS symptoms as her pain does occur prior to bowel movements and improves thereafter at times.  With this, we will also try her on dicyclomine 10 mg as needed.  Recommended starting with once daily dosing and increasing up to 3 times daily as needed.  Hold in the setting of constipation. Can't rule out GYN etiology in the setting of persistent abnormal vaginal bleeding.   Due to reported chest pain with associated shortness of breath with exertion that improves with rest, I have referred her to cardiology for further evaluation.  Notably, she reports father had history of MI.  Plan: 1.  Trial omeprazole 20 mg daily 30 minutes before breakfast. 2.  Trial dicyclomine 10 mg daily as needed for abdominal pain.  Advised to start with once daily and increase up to 3 times daily as needed.  Hold in the setting of constipation. 3.  Follow a low-fat diet.  Also recommended avoiding caffeine and carbonated beverages. 4.  Refer to cardiology. 5.  Follow-up in 8 weeks.    [name], PA-C [facility] 02/28/2021

## 2021-02-28 ENCOUNTER — Ambulatory Visit (INDEPENDENT_AMBULATORY_CARE_PROVIDER_SITE_OTHER): Payer: Medicaid Other | Admitting: Gastroenterology

## 2021-02-28 ENCOUNTER — Other Ambulatory Visit: Payer: Self-pay

## 2021-02-28 ENCOUNTER — Encounter: Payer: Self-pay | Admitting: Gastroenterology

## 2021-02-28 ENCOUNTER — Other Ambulatory Visit: Payer: Self-pay | Admitting: *Deleted

## 2021-02-28 VITALS — BP 132/68 | HR 105 | Temp 96.9°F | Ht 65.0 in | Wt 216.8 lb

## 2021-02-28 DIAGNOSIS — R109 Unspecified abdominal pain: Secondary | ICD-10-CM | POA: Insufficient documentation

## 2021-02-28 DIAGNOSIS — R6881 Early satiety: Secondary | ICD-10-CM | POA: Diagnosis not present

## 2021-02-28 DIAGNOSIS — R079 Chest pain, unspecified: Secondary | ICD-10-CM | POA: Diagnosis not present

## 2021-02-28 DIAGNOSIS — R0602 Shortness of breath: Secondary | ICD-10-CM

## 2021-02-28 MED ORDER — DICYCLOMINE HCL 10 MG PO CAPS
ORAL_CAPSULE | ORAL | 1 refills | Status: DC
Start: 2021-02-28 — End: 2021-04-28

## 2021-02-28 MED ORDER — OMEPRAZOLE 20 MG PO CPDR: 20.0000 mg | DELAYED_RELEASE_CAPSULE | Freq: Every day | ORAL | 3 refills | Status: DC

## 2021-02-28 NOTE — Patient Instructions (Signed)
We are referring you to a cardiologist (heart doctor) for further evaluation of chest pain and shortness of breath with exertion.  For your abdominal pain: Start omeprazole 20 mg daily 30 minutes before breakfast. I have sent a medication called dicyclomine (Bentyl) to your pharmacy.  This is a medication to take as needed for abdominal pain.  Start with 1 capsule in the morning.  You may increase this to 3 times daily as needed.  Hold this medication if you become constipated.  *Other side effects of dicyclomine include dry mouth, blurry vision, dizziness, trouble with urination.  Please let me know if this occurs.  Recommend following a low-fat diet.  Avoid fried, fatty, greasy foods. Meats should be lean (poultry or fish) and baked, boiled, or broiled. Avoid caffeine and carbonated beverages.  We will plan to see back in 8 weeks.  Please call with questions or concerns prior.   [name], PA-C [facility]

## 2021-03-15 ENCOUNTER — Encounter: Payer: Self-pay | Admitting: Internal Medicine

## 2021-04-28 ENCOUNTER — Other Ambulatory Visit: Payer: Self-pay | Admitting: Gastroenterology

## 2021-04-28 DIAGNOSIS — R109 Unspecified abdominal pain: Secondary | ICD-10-CM

## 2021-05-02 ENCOUNTER — Ambulatory Visit (HOSPITAL_COMMUNITY)
Admission: RE | Admit: 2021-05-02 | Discharge: 2021-05-02 | Disposition: A | Discharge status: Home / Self Care | Payer: Medicaid Other | Source: Ambulatory Visit | Attending: Internal Medicine | Admitting: Internal Medicine

## 2021-05-02 ENCOUNTER — Ambulatory Visit (INDEPENDENT_AMBULATORY_CARE_PROVIDER_SITE_OTHER): Payer: Medicaid Other

## 2021-05-02 ENCOUNTER — Encounter: Payer: Self-pay | Admitting: Internal Medicine

## 2021-05-02 ENCOUNTER — Other Ambulatory Visit: Payer: Self-pay | Admitting: Internal Medicine

## 2021-05-02 ENCOUNTER — Other Ambulatory Visit: Payer: Self-pay

## 2021-05-02 ENCOUNTER — Ambulatory Visit (INDEPENDENT_AMBULATORY_CARE_PROVIDER_SITE_OTHER): Payer: Medicaid Other | Admitting: Internal Medicine

## 2021-05-02 VITALS — BP 120/80 | HR 115 | Ht 66.0 in | Wt 219.0 lb

## 2021-05-02 DIAGNOSIS — R0602 Shortness of breath: Secondary | ICD-10-CM

## 2021-05-02 DIAGNOSIS — Z79899 Other long term (current) drug therapy: Secondary | ICD-10-CM | POA: Insufficient documentation

## 2021-05-02 DIAGNOSIS — R000 Tachycardia, unspecified: Secondary | ICD-10-CM | POA: Diagnosis not present

## 2021-05-02 DIAGNOSIS — R079 Chest pain, unspecified: Secondary | ICD-10-CM

## 2021-05-02 LAB — CBC WITH DIFFERENTIAL/PLATELET
Abs Immature Granulocytes: 0.01 10*3/uL (ref 0.00–0.07)
Basophils Absolute: 0 10*3/uL (ref 0.0–0.1)
Basophils Relative: 1 %
Eosinophils Absolute: 0.1 10*3/uL (ref 0.0–0.5)
Eosinophils Relative: 1 %
HCT: 36.7 % (ref 36.0–46.0)
Hemoglobin: 10.8 g/dL — ABNORMAL LOW (ref 12.0–15.0)
Immature Granulocytes: 0 %
Lymphocytes Relative: 30 %
Lymphs Abs: 2 10*3/uL (ref 0.7–4.0)
MCH: 24 pg — ABNORMAL LOW (ref 26.0–34.0)
MCHC: 29.4 g/dL — ABNORMAL LOW (ref 30.0–36.0)
MCV: 81.6 fL (ref 80.0–100.0)
Monocytes Absolute: 0.4 10*3/uL (ref 0.1–1.0)
Monocytes Relative: 6 %
Neutro Abs: 4.1 10*3/uL (ref 1.7–7.7)
Neutrophils Relative %: 62 %
Platelets: 343 10*3/uL (ref 150–400)
RBC: 4.5 MIL/uL (ref 3.87–5.11)
RDW: 15.4 % (ref 11.5–15.5)
WBC: 6.6 10*3/uL (ref 4.0–10.5)
nRBC: 0 % (ref 0.0–0.2)

## 2021-05-02 LAB — BASIC METABOLIC PANEL
Anion gap: 6 (ref 5–15)
BUN: 7 mg/dL (ref 6–20)
CO2: 25 mmol/L (ref 22–32)
Calcium: 9 mg/dL (ref 8.9–10.3)
Chloride: 107 mmol/L (ref 98–111)
Creatinine, Ser: 0.78 mg/dL (ref 0.44–1.00)
GFR, Estimated: >60 mL/min (ref >60)
Glucose, Bld: 102 mg/dL — ABNORMAL HIGH (ref 70–99)
Potassium: 4.5 mmol/L (ref 3.5–5.1)
Sodium: 138 mmol/L (ref 135–145)

## 2021-05-02 LAB — URINALYSIS, ROUTINE W REFLEX MICROSCOPIC
Bilirubin Urine: NEGATIVE
Glucose, UA: NEGATIVE mg/dL
Hgb urine dipstick: NEGATIVE
Ketones, ur: NEGATIVE mg/dL
Leukocytes,Ua: NEGATIVE
Nitrite: NEGATIVE
Protein, ur: NEGATIVE mg/dL
Specific Gravity, Urine: 1.026 (ref 1.005–1.030)
pH: 5 (ref 5.0–8.0)

## 2021-05-02 LAB — TSH: TSH: 1.495 u[IU]/mL (ref 0.350–4.500)

## 2021-05-02 NOTE — Patient Instructions (Signed)
Medication Instructions:  Your physician recommends that you continue on your current medications as directed. Please refer to the Current Medication list given to you today.  *If you need a refill on your cardiac medications before your next appointment, please call your pharmacy*   Lab Work: Your physician recommends that you return for lab work in: Today ( BMET, CBC, TSH, UA)   If you have labs (blood work) drawn today and your tests are completely normal, you will receive your results only by: [place] (if you have MyChart) OR A paper copy in the mail If you have any lab test that is abnormal or we need to change your treatment, we will call you to review the results.   Testing/Procedures: [facility] [place]- Long Term Monitor Instructions   Your physician has requested you wear your ZIO patch monitor___2___days.   This is a single patch monitor.  [employer] supplies one patch monitor per enrollment.  Additional stickers are not available.   Please do not apply patch if you will be having a Nuclear Stress Test, Echocardiogram, Cardiac CT, MRI, or Chest Xray during the time frame you would be wearing the monitor. The patch cannot be worn during these tests.  You cannot remove and re-apply the ZIO XT patch monitor.   Your ZIO patch monitor will be sent USPS Priority mail from [employer] directly to your home address. The monitor may also be mailed to a PO BOX if home delivery is not available.   It may take 3-5 days to receive your monitor after you have been enrolled.   Once you have received you monitor, please review enclosed instructions.  Your monitor has already been registered assigning a specific monitor serial # to you.   Applying the monitor   Shave hair from upper left chest.   Hold abrader disc by orange tab.  Rub abrader in 40 strokes over left upper chest as indicated in your monitor instructions.   Clean area with 4 enclosed alcohol pads .  Use all pads to assure  are is cleaned thoroughly.  Let dry.   Apply patch as indicated in monitor instructions.  Patch will be place under collarbone on left side of chest with arrow pointing upward.   Rub patch adhesive wings for 2 minutes.Remove white label marked "1".  Remove white label marked "2".  Rub patch adhesive wings for 2 additional minutes.   While looking in a mirror, press and release button in center of patch.  A small green light will flash 3-4 times .  This will be your only indicator the monitor has been turned on.     Do not shower for the first 24 hours.  You may shower after the first 24 hours.   Press button if you feel a symptom. You will hear a small click.  Record Date, Time and Symptom in the Patient Log Book.   When you are ready to remove patch, follow instructions on last 2 pages of Patient Log Book.  Stick patch monitor onto last page of Patient Log Book.   Place Patient Log Book in [place] box.  Use locking tab on box and tape box closed securely.  The [employer] has [employer] on it.  Please place in mailbox as soon as possible.  Your physician should have your test results approximately 7 days after the monitor has been mailed back to [facility].   Call [place] at [phone] if you have questions  regarding your ZIO XT patch monitor.  Call them immediately if you see an orange light blinking on your monitor.   If your monitor falls off in less than 4 days contact our Monitor department at [phone].  If your monitor becomes loose or falls off after 4 days call [employer] at [phone] for suggestions on securing your monitor.     Follow-Up: At [facility], you and your health needs are our priority.  As part of our continuing mission to provide you with exceptional heart care, we have created designated Provider Care Teams.  These Care Teams include your primary Cardiologist (physician) and Advanced Practice Providers (APPs -  Physician  Assistants and Nurse Practitioners) who all work together to provide you with the care you need, when you need it.  We recommend signing up for the patient portal called "MyChart".  Sign up information is provided on this After Visit Summary.  MyChart is used to connect with patients for Virtual Visits (Telemedicine).  Patients are able to view lab/test results, encounter notes, upcoming appointments, etc.  Non-urgent messages can be sent to your provider as well.   To learn more about what you can do with MyChart, go to [email or web address].    Your next appointment:    Pending test results   The format for your next appointment:   In Person  Provider:   Dorris Carnes, MD   Other Instructions Thank you for choosing [facility]!

## 2021-05-02 NOTE — Progress Notes (Signed)
Cardiology Office Note   Date:  05/02/2021   ID:  Madeline Tucker, DOB August 20, 1976, MRN [id number]  PCP:  [facility], [place]  Cardiologist:   Dorris Carnes, MD    Pt referred by [facility] Dept  for eval of chst tightness   History of Present Illness: Madeline Tucker is a 45 y.o. female with a history of abdominal pain and also chest tightness      The pt says she was feeling good 1 year ago   Had rare chst tightness  Now the pt says she feels chest tight often   Says she is barely able to walk  Notes some dizziness   No syncope   Does note some palpiations   Her hx is difficult to tell when she has spells    The pt says she tires easy   Says she sleeps al the time Does give out with activity   Has to rest     The pt says she drinks a lot of water        Current Meds  Medication Sig   dicyclomine (BENTYL) 10 MG capsule START WITH 1 CAPSULE BY MOUTH EVERY MORNING AND INCREASE TO 1 CAPSULE UP TO THREE TIMES DAILY IF NEEDED(HOLD IF CONSTIPATED)   ibuprofen (ADVIL) 200 MG tablet Take 200 mg by mouth every 6 (six) hours as needed for mild pain or moderate pain.   omeprazole (PRILOSEC) 20 MG capsule Take 1 capsule (20 mg total) by mouth daily before breakfast.   ondansetron (ZOFRAN) 4 MG tablet Take 1 tablet (4 mg total) by mouth every 6 (six) hours as needed for nausea or vomiting.   Relugolix-Estradiol-Norethind (MYFEMBREE) 40-1-0.5 MG TABS Take 1 tablet by mouth daily.     Allergies:   Patient has no known allergies.   Past Medical History:  Diagnosis Date   Anxiety    Mental disorder     Past Surgical History:  Procedure Laterality Date   COLONOSCOPY WITH PROPOFOL N/A 12/27/2020    Surgeon: [name], DO;  Nonbleeding internal hemorrhoids, 2 mm polyp in sigmoid colon resected and retrieved (tubular adenoma).  Recommended repeat colonoscopy in 5 years.   POLYPECTOMY  12/27/2020   Procedure: POLYPECTOMY;  Surgeon: [name], DO;   [facility];  Service: Endoscopy;;     Social History:  The patient  reports that she has never smoked. She has never used smokeless tobacco. She reports that she does not drink alcohol and does not use drugs.   Family History:  The patient's family history includes Cancer in her mother; Diabetes in her mother; Heart attack in her father; Stroke in her mother.    ROS:  Please see the history of present illness. All other systems are reviewed and  Negative to the above problem except as noted.    PHYSICAL EXAM: VS:  BP 120/80   Pulse (!) 115   Ht 5\' 6"  (1.676 m)   Wt 219 lb (99.3 kg)   SpO2 98%   BMI 35.35 kg/m   Orthostatic BP   Laying 120/80  P 104  Sitting   110/77  P 104  Standing  104/68  P 112   GEN: Morbidly obese 45 yo in no acute distress  HEENT: normal  Neck: no JVD, carotid bruits  Cardiac: RRR; no murmurs,  No LE edema  Respiratory:  clear to auscultation bilaterally GI: soft, nontender, nondistended, + BS  No hepatomegaly  MS: no  deformity Moving all extremities   Skin: warm and dry, no rash Neuro:  Strength and sensation are intact Psych: euthymic mood, full affect   EKG:  EKG is ordered today.  ST  116 bpm  Sl ST depresion   AVF, V3, V4  T wave inversionIII, AVF, V3/V4     Lipid Panel No results found for: CHOL, TRIG, HDL, CHOLHDL, VLDL, LDLCALC, LDLDIRECT    Wt Readings from Last 3 Encounters:  05/02/21 219 lb (99.3 kg)  02/28/21 216 lb 12.8 oz (98.3 kg)  02/08/21 219 lb (99.3 kg)      ASSESSMENT AND PLAN:  1  Chest tightness   I am not convinced this represents angina    She is not orthostatic on exam     I would recommend an echo to evaluate LV/RV systolic / diastolic functions  Will get CBC, BMET, TSH, UA    2 Palpitaitons  Set up for Zio patch     F/U based on test results   Current medicines are reviewed at length with the patient today.  The patient does not have concerns regarding medicines.  Signed, Dorris Carnes, MD   05/02/2021 11:53 PM    [address], [place], [place]  [address] Phone: [phone]; Fax: [phone]

## 2021-05-03 ENCOUNTER — Encounter: Payer: Self-pay | Admitting: *Deleted

## 2021-05-03 ENCOUNTER — Telehealth: Payer: Self-pay | Admitting: *Deleted

## 2021-05-03 DIAGNOSIS — Z79899 Other long term (current) drug therapy: Secondary | ICD-10-CM

## 2021-05-03 MED ORDER — IRON (FERROUS SULFATE) 325 (65 FE) MG PO TABS: 325.0000 mg | ORAL_TABLET | Freq: Every day | ORAL | 11 refills | Status: DC

## 2021-05-03 NOTE — Telephone Encounter (Signed)
-----   Message from Fay Records, MD sent at 05/02/2021  8:44 PM EDT ----- Thyroid function is normal Urine is concentrated   Needs to drink more fluids Electrolytes and kidney function are OK  CBC:  Pt mildly anemic   Consistentwith iron deficiency    WOuld start on FeSO4  324 mg    Take daily   F/U CBC with fe, ferritin, in 1 month

## 2021-05-09 ENCOUNTER — Encounter: Payer: Self-pay | Admitting: Obstetrics & Gynecology

## 2021-05-09 ENCOUNTER — Other Ambulatory Visit: Payer: Self-pay

## 2021-05-09 ENCOUNTER — Ambulatory Visit (INDEPENDENT_AMBULATORY_CARE_PROVIDER_SITE_OTHER): Payer: Medicaid Other | Admitting: Obstetrics & Gynecology

## 2021-05-09 VITALS — BP 104/73 | HR 89 | Ht 66.5 in | Wt 221.0 lb

## 2021-05-09 DIAGNOSIS — N938 Other specified abnormal uterine and vaginal bleeding: Secondary | ICD-10-CM

## 2021-05-09 DIAGNOSIS — N946 Dysmenorrhea, unspecified: Secondary | ICD-10-CM | POA: Diagnosis not present

## 2021-05-09 MED ORDER — MYFEMBREE 40-1-0.5 MG PO TABS: 1.0000 | ORAL_TABLET | Freq: Every day | ORAL | 6 refills | Status: DC

## 2021-05-09 NOTE — Progress Notes (Signed)
Follow up appointment for response to therapy  Chief Complaint  Patient presents with   Follow-up    On bleeding    Blood pressure 104/73, pulse 89, height 5' 6.5" (1.689 m), weight 221 lb (100.2 kg).  Patient still had a relatively normal period but no bleeding since then No cramping and no pelvic pain No complaints with the medication No other issues or problems     MEDS ordered this encounter: Meds ordered this encounter  Medications   Relugolix-Estradiol-Norethind (MYFEMBREE) 40-1-0.5 MG TABS    Sig: Take 1 tablet by mouth daily.    Dispense:  30 tablet    Refill:  6    Orders for this encounter: No orders of the defined types were placed in this encounter.   Impression:   ICD-10-CM   1. DUB (dysfunctional uterine bleeding): Chronic + stable  N93.8    resolved on myfembree    2. Dysmenorrhea: chronic + stable  N94.6    resolved on myfembree       Plan: Continue myfembree for now, revisit use in 6 months then may consider switch back to plain norethindrone with chronically suppressed endometrium Pt desires to continue medicine as well  Follow Up: Return in about 6 months (around 11/08/2021) for Follow up, with Dr Elonda Husky.      All questions were answered.  Past Medical History:  Diagnosis Date   Anxiety    Mental disorder     Past Surgical History:  Procedure Laterality Date   COLONOSCOPY WITH PROPOFOL N/A 12/27/2020    Surgeon: [name], DO;  Nonbleeding internal hemorrhoids, 2 mm polyp in sigmoid colon resected and retrieved (tubular adenoma).  Recommended repeat colonoscopy in 5 years.   POLYPECTOMY  12/27/2020   Procedure: POLYPECTOMY;  Surgeon: [name], DO;  [facility];  Service: Endoscopy;;    OB History     Gravida  0   Para  0   Term  0   Preterm  0   AB  0   Living  0      SAB  0   IAB  0   Ectopic  0   Multiple  0   Live Births  0           No Known Allergies  Social History    Socioeconomic History   Marital status: Single    Spouse name: Not on file   Number of children: Not on file   Years of education: Not on file   Highest education level: Not on file  Occupational History   Not on file  Tobacco Use   Smoking status: Never   Smokeless tobacco: Never  Vaping Use   Vaping Use: Never used  Substance and Sexual Activity   Alcohol use: Never   Drug use: Never   Sexual activity: Never    Birth control/protection: None  Other Topics Concern   Not on file  Social History Narrative   Not on file   Social Determinants of Health   Financial Resource Strain: Low Risk    Difficulty of Paying Living Expenses: Not hard at all  Food Insecurity: No Food Insecurity   Worried About Charity fundraiser in the Last Year: Never true   Ran Out of Food in the Last Year: Never true  Transportation Needs: No Transportation Needs   Lack of Transportation (Medical): No   Lack of Transportation (Non-Medical): No  Physical Activity: Insufficiently Active   Days  of Exercise per Week: 2 days   Minutes of Exercise per Session: 10 min  Stress: No Stress Concern Present   Feeling of Stress : Only a little  Social Connections: Socially Isolated   Frequency of Communication with Friends and Family: Never   Frequency of Social Gatherings with Friends and Family: Never   Attends Religious Services: Never   Marine scientist or Organizations: Yes   Attends Archivist Meetings: Never   Marital Status: Never married    Family History  Problem Relation Age of Onset   Heart attack Father    Cancer Mother        doesn't know what type.    Diabetes Mother    Stroke Mother

## 2021-05-13 ENCOUNTER — Ambulatory Visit: Payer: Medicaid Other | Admitting: Gastroenterology

## 2021-05-25 ENCOUNTER — Telehealth: Payer: Self-pay

## 2021-05-25 NOTE — Telephone Encounter (Signed)
-----   Message from Fay Records, MD sent at 05/22/2021 10:49 PM EDT ----- Elwyn Reach patch shows no arrhythmias   Only normal rhythm with rare skip   Not sensed Stay hydrated

## 2021-05-25 NOTE — Telephone Encounter (Signed)
Left message for pt to give our office a call back regarding results X3. Letter mailed.

## 2021-06-02 ENCOUNTER — Other Ambulatory Visit (HOSPITAL_COMMUNITY)
Admission: RE | Admit: 2021-06-02 | Discharge: 2021-06-02 | Disposition: A | Discharge status: Home / Self Care | Payer: Medicaid Other | Source: Ambulatory Visit | Attending: Internal Medicine | Admitting: Internal Medicine

## 2021-06-02 ENCOUNTER — Other Ambulatory Visit: Payer: Self-pay

## 2021-06-02 ENCOUNTER — Other Ambulatory Visit: Payer: Self-pay | Admitting: Obstetrics & Gynecology

## 2021-06-02 DIAGNOSIS — Z79899 Other long term (current) drug therapy: Secondary | ICD-10-CM | POA: Diagnosis present

## 2021-06-02 LAB — CBC
HCT: 37.7 % (ref 36.0–46.0)
Hemoglobin: 11.2 g/dL — ABNORMAL LOW (ref 12.0–15.0)
MCH: 24.5 pg — ABNORMAL LOW (ref 26.0–34.0)
MCHC: 29.7 g/dL — ABNORMAL LOW (ref 30.0–36.0)
MCV: 82.3 fL (ref 80.0–100.0)
Platelets: 292 10*3/uL (ref 150–400)
RBC: 4.58 MIL/uL (ref 3.87–5.11)
RDW: 18.6 % — ABNORMAL HIGH (ref 11.5–15.5)
WBC: 6.6 10*3/uL (ref 4.0–10.5)
nRBC: 0 % (ref 0.0–0.2)

## 2021-06-02 LAB — IRON: Iron: 26 ug/dL — ABNORMAL LOW (ref 28–170)

## 2021-06-02 LAB — FERRITIN: Ferritin: 13 ng/mL (ref 11–307)

## 2021-06-10 ENCOUNTER — Encounter: Payer: Self-pay | Admitting: *Deleted

## 2021-06-17 ENCOUNTER — Telehealth: Payer: Self-pay | Admitting: *Deleted

## 2021-06-17 DIAGNOSIS — Z79899 Other long term (current) drug therapy: Secondary | ICD-10-CM

## 2021-06-17 NOTE — Telephone Encounter (Signed)
-----   Message from Rodman Key, RN sent at 06/06/2021  2:48 PM EDT ----- Madeline Tucker patient.

## 2021-06-18 NOTE — Progress Notes (Signed)
Referring Provider: Health, Andre Lefort* Primary Care Physician:  Health, [facility] Primary GI Physician: Dr. Abbey Chatters  Chief Complaint  Patient presents with   Abdominal Pain    Right abd    HPI:   ISEBELLA Tucker is a 45 y.o. female presenting today for follow-up of right-sided abdominal pain.   Prior evaluation has included colonoscopy in February 2022 revealing nonbleeding internal hemorrhoids and 2 mm tubular adenoma with recommendations to repeat colonoscopy in 5 years.  Abdominal ultrasound in October 2021 with normal-appearing gallbladder without biliary dilation.  Question of subtle liver masses up to 15 mm in diameter, but follow-up MRI with no hepatic masses identified.  CT A/P without contrast 12/21/2020 with questionable enlargement of lower uterine segment/cervix, physiologic bilateral ovarian cysts suspected, otherwise no significant findings.  She had follow-up with OB/GYN 12/28/2020 with normal pelvic exam.  Last seen in our office April 2022.  Intermittent dull, daily, mid right sided abdominal pain continued using ibuprofen as needed.  No association with meals.  Admitted to early satiety.  Occasionally, pain prior to bowel movements that improves thereafter.  No diarrhea, constipation, BRBPR, or melena.  Continued with vaginal bleeding and clotting.  Also reported chest pain when walking up stairs, cleaning house with associated shortness of breath, resolves with rest.  Consider EGD for further evaluation, but due to chest pain, plan to hold off.  Would empirically start omeprazole 20 mg daily.  Also queried whether abdominal pain may be related to IBS and would try dicyclomine 10 mg as needed, recommended starting once daily and increasing as needed.  Could also have GYN etiology with persistent abnormal vaginal bleeding.  Patient saw cardiology in June.  They were not convinced that her symptoms represented angina. Recommended echo and Zio patch.  Zio  patch with no arrhythmias. Echo is scheduled for 06/28/2021.  Office visit with OB/GYN in June for dysfunctional uterine bleeding.  Noted symptoms resolved with myfembree.   Today:  Reports abdominal pain improved quite a bit when she is taking dicyclomine.  States she was taking it once a day and her abdominal pain had essentially resolved, but she developed constipation and discontinued the medication.  Currently taking dicyclomine about once a week when her abdominal pain gets more severe.  Pain continues to be on the right side and is unaffected by meals.  Comes on prior to a bowel movement.  Currently, bowels are moving daily. Denies nausea or vomiting.  Eating well.  No unintentional weight loss.  Abnormal vaginal bleeding has resolved.  Denies BRBPR or melena.  Denies reflux symptoms, dysphagia, or early satiety.  Prior reports of chest pain have now resolved.  She is taking omeprazole once daily as prescribed at her last office visit.  Past Medical History:  Diagnosis Date   Anxiety    Mental disorder     Past Surgical History:  Procedure Laterality Date   COLONOSCOPY WITH PROPOFOL N/A 12/27/2020    Surgeon: [name], DO;  Nonbleeding internal hemorrhoids, 2 mm polyp in sigmoid colon resected and retrieved (tubular adenoma).  Recommended repeat colonoscopy in 5 years.   POLYPECTOMY  12/27/2020   Procedure: POLYPECTOMY;  Surgeon: [name], DO;  [facility];  Service: Endoscopy;;    Current Outpatient Medications  Medication Sig Dispense Refill   dicyclomine (BENTYL) 10 MG capsule START WITH 1 CAPSULE BY MOUTH EVERY MORNING AND INCREASE TO 1 CAPSULE UP TO THREE TIMES DAILY IF NEEDED(HOLD IF CONSTIPATED) 90  capsule 1   ibuprofen (ADVIL) 200 MG tablet Take 200 mg by mouth every 6 (six) hours as needed for mild pain or moderate pain.     Iron, Ferrous Sulfate, 325 (65 Fe) MG TABS Take 325 mg by mouth daily. 30 tablet 11   MYFEMBREE 40-1-0.5 MG TABS TAKE 1  TABLET BY MOUTH DAILY 30 tablet 6   omeprazole (PRILOSEC) 20 MG capsule Take 1 capsule (20 mg total) by mouth daily before breakfast. 30 capsule 3   ondansetron (ZOFRAN) 4 MG tablet Take 1 tablet (4 mg total) by mouth every 6 (six) hours as needed for nausea or vomiting. 20 tablet 0   No current facility-administered medications for this visit.    Allergies as of 06/20/2021   (No Known Allergies)    Family History  Problem Relation Age of Onset   Heart attack Father    Cancer Mother        doesn't know what type.    Diabetes Mother    Stroke Mother     Social History   Socioeconomic History   Marital status: Single    Spouse name: Not on file   Number of children: Not on file   Years of education: Not on file   Highest education level: Not on file  Occupational History   Not on file  Tobacco Use   Smoking status: Never   Smokeless tobacco: Never  Vaping Use   Vaping Use: Never used  Substance and Sexual Activity   Alcohol use: Never   Drug use: Never   Sexual activity: Never    Birth control/protection: None  Other Topics Concern   Not on file  Social History Narrative   Not on file   Social Determinants of Health   Financial Resource Strain: Low Risk    Difficulty of Paying Living Expenses: Not hard at all  Food Insecurity: No Food Insecurity   Worried About Charity fundraiser in the Last Year: Never true   [place] in the Last Year: Never true  Transportation Needs: No Transportation Needs   Lack of Transportation (Medical): No   Lack of Transportation (Non-Medical): No  Physical Activity: Insufficiently Active   Days of Exercise per Week: 2 days   Minutes of Exercise per Session: 10 min  Stress: No Stress Concern Present   Feeling of Stress : Only a little  Social Connections: Socially Isolated   Frequency of Communication with Friends and Family: Never   Frequency of Social Gatherings with Friends and Family: Never   Attends Religious  Services: Never   Marine scientist or Organizations: Yes   Attends Music therapist: Never   Marital Status: Never married    Review of Systems: Gen: Denies fever, chills, cold or flulike symptoms, lightheadedness, dizziness, presyncope, syncope. CV: Denies chest pain, palpitations Resp: Denies dyspnea or cough. GI: See HPI  Heme: See HPI  Physical Exam: BP 122/81   Pulse 97   Temp (!) 97.3 F (36.3 C) (Temporal)   Ht '5\' 6"'$  (1.676 m)   Wt 222 lb 6.4 oz (100.9 kg)   BMI 35.90 kg/m  General:   Alert and oriented. No distress noted. Pleasant and cooperative.  Head:  Normocephalic and atraumatic. Eyes:  Conjuctiva clear without scleral icterus. Heart:  S1, S2 present without murmurs appreciated. Lungs:  Clear to auscultation bilaterally. No wheezes, rales, or rhonchi. No distress.  Abdomen:  +BS, soft, non-tender and non-distended. No rebound or guarding.  No HSM or masses noted. Msk:  Symmetrical without gross deformities. Normal posture. Extremities:  Without edema. Neurologic:  Alert and  oriented x4 Psych: Normal mood and affect.    Assessment: 44 year old female presenting today for follow-up of right-sided abdominal pain with fairly extensive evaluation thus far including included colonoscopy in February 2022 revealing nonbleeding internal hemorrhoids and 2 mm tubular adenoma with recommendations to repeat colonoscopy in 5 years.  Abdominal ultrasound in October 2021 with normal-appearing gallbladder without biliary dilation.  Question of subtle liver masses up to 15 mm in diameter, but follow-up MRI with no hepatic masses identified.  CT A/P without contrast 12/21/2020 with questionable enlargement of lower uterine segment/cervix, physiologic bilateral ovarian cysts suspected, otherwise no significant findings.  She had follow-up with OB/GYN 12/28/2020 with normal pelvic exam.  It became evident that symptoms seem to be more associated with bowel movements.  She  is started on dicyclomine at her last visit in April 2022 and reports significant improvement in her abdominal pain with dicyclomine.  Unfortunately, with taking dicyclomine once daily, she developed constipation, so she discontinued the medication is currently taking once a week when abdominal pain is most severe.  Bowels have returned to normal and she denies bright red blood per rectum or melena.  Again, there is no association between abdominal pain and meals, and she denies nausea or vomiting.  Her abdominal exam is benign today.  Suspect her symptoms are secondary to IBS.  As dicyclomine provided significant improvement, I recommended she resume once daily and start MiraLAX 17 g daily to keep bowels moving.  Chest pain: Previously reported intermittent chest pain with exertion that resolves with rest.  No typical GERD symptoms, nausea, or vomiting, but she has empirically started on omeprazole 20 mg daily and referred to cardiology.  Cardiology was not convinced her symptoms were secondary to angina.  She completed a Zio patch which did not reveal any arrhythmias.  Echo is scheduled for 8/16.  Notably, she reports her chest pain has resolved since starting omeprazole.  Query atypical GERD.  Advise she continue omeprazole 20 mg daily for now.  Plan: 1.  Resume dicyclomine 10 mg daily. 2.  Start MiraLAX 17 g daily in 8 ounces of water.  May reduce frequency if she develops frequent loose stools. 3.  Continue omeprazole 20 mg daily 30 minutes for breakfast. 4.  Follow-up in 6 months or sooner if needed.   [name], PA-C [facility] 06/20/2021

## 2021-06-20 ENCOUNTER — Other Ambulatory Visit: Payer: Self-pay

## 2021-06-20 ENCOUNTER — Encounter: Payer: Self-pay | Admitting: Gastroenterology

## 2021-06-20 ENCOUNTER — Ambulatory Visit: Payer: Medicaid Other | Admitting: Gastroenterology

## 2021-06-20 VITALS — BP 122/81 | HR 97 | Temp 97.3°F | Ht 66.0 in | Wt 222.4 lb

## 2021-06-20 DIAGNOSIS — R1031 Right lower quadrant pain: Secondary | ICD-10-CM

## 2021-06-20 DIAGNOSIS — K219 Gastro-esophageal reflux disease without esophagitis: Secondary | ICD-10-CM | POA: Insufficient documentation

## 2021-06-20 NOTE — Patient Instructions (Addendum)
For abdominal pain: Resume dicyclomine once daily as this worked very well for you. As dicyclomine caused some constipation, you may start MiraLAX 1 capful (17 g) daily in 8 ounces of water. If MiraLAX causes frequent loose stools, you may use every other day or as needed.  Continue omeprazole 20 mg daily 30 minutes before breakfast.  We will follow-up with you in 6 months.  Do not hesitate to call if you have questions or concerns prior to your next visit.  It was nice to see you today!   [name], PA-C [facility]

## 2021-06-23 ENCOUNTER — Other Ambulatory Visit: Payer: Self-pay | Admitting: Gastroenterology

## 2021-06-23 DIAGNOSIS — R109 Unspecified abdominal pain: Secondary | ICD-10-CM

## 2021-06-23 DIAGNOSIS — R079 Chest pain, unspecified: Secondary | ICD-10-CM

## 2021-06-28 ENCOUNTER — Other Ambulatory Visit: Payer: Self-pay

## 2021-06-28 ENCOUNTER — Ambulatory Visit (HOSPITAL_COMMUNITY)
Admission: RE | Admit: 2021-06-28 | Discharge: 2021-06-28 | Disposition: A | Discharge status: Home / Self Care | Payer: Medicaid Other | Source: Ambulatory Visit | Attending: Internal Medicine | Admitting: Internal Medicine

## 2021-06-28 DIAGNOSIS — R079 Chest pain, unspecified: Secondary | ICD-10-CM | POA: Diagnosis present

## 2021-06-28 DIAGNOSIS — R0602 Shortness of breath: Secondary | ICD-10-CM | POA: Insufficient documentation

## 2021-06-28 LAB — ECHOCARDIOGRAM COMPLETE
Area-P 1/2: 3.89 cm2
S' Lateral: 2.5 cm

## 2021-06-28 NOTE — Progress Notes (Signed)
*  PRELIMINARY RESULTS* Echocardiogram 2D Echocardiogram has been performed.  Samuel Germany 06/28/2021, 9:10 AM

## 2021-07-04 ENCOUNTER — Encounter: Payer: Self-pay | Admitting: *Deleted

## 2021-08-15 ENCOUNTER — Encounter: Payer: Self-pay | Admitting: Obstetrics & Gynecology

## 2021-08-15 ENCOUNTER — Other Ambulatory Visit (HOSPITAL_COMMUNITY): Payer: Self-pay | Admitting: *Deleted

## 2021-08-15 DIAGNOSIS — Z1231 Encounter for screening mammogram for malignant neoplasm of breast: Secondary | ICD-10-CM

## 2021-09-12 ENCOUNTER — Other Ambulatory Visit: Payer: Self-pay

## 2021-09-12 ENCOUNTER — Ambulatory Visit (HOSPITAL_COMMUNITY)
Admission: RE | Admit: 2021-09-12 | Discharge: 2021-09-12 | Disposition: A | Discharge status: Home / Self Care | Payer: Medicaid Other | Source: Ambulatory Visit | Attending: *Deleted | Admitting: *Deleted

## 2021-09-12 DIAGNOSIS — Z1231 Encounter for screening mammogram for malignant neoplasm of breast: Secondary | ICD-10-CM | POA: Insufficient documentation

## 2021-10-31 ENCOUNTER — Ambulatory Visit: Payer: Medicaid Other | Admitting: Obstetrics & Gynecology

## 2021-10-31 ENCOUNTER — Other Ambulatory Visit: Payer: Self-pay

## 2021-10-31 ENCOUNTER — Encounter: Payer: Self-pay | Admitting: Obstetrics & Gynecology

## 2021-10-31 VITALS — BP 128/83 | HR 91 | Ht 66.5 in | Wt 230.0 lb

## 2021-10-31 DIAGNOSIS — N946 Dysmenorrhea, unspecified: Secondary | ICD-10-CM | POA: Diagnosis not present

## 2021-10-31 DIAGNOSIS — N938 Other specified abnormal uterine and vaginal bleeding: Secondary | ICD-10-CM | POA: Diagnosis not present

## 2021-10-31 MED ORDER — MYFEMBREE 40-1-0.5 MG PO TABS: 1.0000 | ORAL_TABLET | Freq: Every day | ORAL | 6 refills | Status: DC

## 2021-10-31 NOTE — Progress Notes (Signed)
Follow up appointment for results  Chief Complaint  Patient presents with   Follow-up    On bleeding and meds    Blood pressure 128/83, pulse 91, height 5' 6.5" (1.689 m), weight 230 lb (104.3 kg).    Pt is having regular cycles on the Myfembree spotting No pain associated Never bleeding between cycles  MEDS ordered this encounter: Meds ordered this encounter  Medications   Relugolix-Estradiol-Norethind (MYFEMBREE) 40-1-0.5 MG TABS    Sig: Take 1 tablet by mouth daily.    Dispense:  30 tablet    Refill:  6    Orders for this encounter: No orders of the defined types were placed in this encounter.   Impression:   ICD-10-CM   1. DUB (dysfunctional uterine bleeding): Chronic + stable  N93.8     2. Dysmenorrhea: chronic + stable  N94.6        Plan: Continue myfembree until 05/2023 then will reassess ongoing management plan  Follow Up: Return in about 6 months (around 05/01/2022) for Follow up, with Dr Elonda Husky.      All questions were answered.  Past Medical History:  Diagnosis Date   Anxiety    Mental disorder     Past Surgical History:  Procedure Laterality Date   COLONOSCOPY WITH PROPOFOL N/A 12/27/2020    Surgeon: [name], DO;  Nonbleeding internal hemorrhoids, 2 mm polyp in sigmoid colon resected and retrieved (tubular adenoma).  Recommended repeat colonoscopy in 5 years.   POLYPECTOMY  12/27/2020   Procedure: POLYPECTOMY;  Surgeon: [name], DO;  [facility];  Service: Endoscopy;;    OB History     Gravida  0   Para  0   Term  0   Preterm  0   AB  0   Living  0      SAB  0   IAB  0   Ectopic  0   Multiple  0   Live Births  0           No Known Allergies  Social History   Socioeconomic History   Marital status: Single    Spouse name: Not on file   Number of children: Not on file   Years of education: Not on file   Highest education level: Not on file  Occupational History   Not on file   Tobacco Use   Smoking status: Never   Smokeless tobacco: Never  Vaping Use   Vaping Use: Never used  Substance and Sexual Activity   Alcohol use: Never   Drug use: Never   Sexual activity: Never    Birth control/protection: None  Other Topics Concern   Not on file  Social History Narrative   Not on file   Social Determinants of Health   Financial Resource Strain: Low Risk    Difficulty of Paying Living Expenses: Not hard at all  Food Insecurity: No Food Insecurity   Worried About Charity fundraiser in the Last Year: Never true   Ran Out of Food in the Last Year: Never true  Transportation Needs: No Transportation Needs   Lack of Transportation (Medical): No   Lack of Transportation (Non-Medical): No  Physical Activity: Insufficiently Active   Days of Exercise per Week: 2 days   Minutes of Exercise per Session: 10 min  Stress: No Stress Concern Present   Feeling of Stress : Only a little  Social Connections: Socially Isolated   Frequency of Communication with  Friends and Family: Never   Frequency of Social Gatherings with Friends and Family: Never   Attends Religious Services: Never   Marine scientist or Organizations: Yes   Attends Archivist Meetings: Never   Marital Status: Never married    Family History  Problem Relation Age of Onset   Heart attack Father    Cancer Mother        doesn't know what type.    Diabetes Mother    Stroke Mother

## 2021-11-04 ENCOUNTER — Ambulatory Visit: Payer: Medicaid Other | Admitting: Obstetrics & Gynecology

## 2021-11-13 ENCOUNTER — Other Ambulatory Visit: Payer: Self-pay | Admitting: Gastroenterology

## 2021-11-13 DIAGNOSIS — R109 Unspecified abdominal pain: Secondary | ICD-10-CM

## 2021-11-13 DIAGNOSIS — R079 Chest pain, unspecified: Secondary | ICD-10-CM

## 2021-12-09 ENCOUNTER — Other Ambulatory Visit: Payer: Self-pay | Admitting: Gastroenterology

## 2021-12-09 DIAGNOSIS — R109 Unspecified abdominal pain: Secondary | ICD-10-CM

## 2021-12-21 NOTE — H&P (View-Only) (Signed)
Referring Provider: Health, Andre Lefort* Primary Care Physician:  Raiford Simmonds., PA-C Primary GI Physician: Dr. Abbey Chatters   Chief Complaint  Patient presents with   Abdominal Pain    Some upper abd pain. Feels full after not eating much. Bowels moving ok   Gastroesophageal Reflux    Not much    HPI:   Madeline Tucker is a 46 y.o. female presenting today for routine follow-up with chief complaint of upper abdominal pain/early satiety.  She has chronic history of intermittent daily RLQ abdominal pain thought to be secondary to IBS.  Extensive evaluation previously including colonoscopy in February 2022 revealing nonbleeding internal hemorrhoids and 2 mm tubular adenoma with recommendations to repeat colonoscopy in 5 years.  Abdominal ultrasound in October 2021 with normal-appearing gallbladder without biliary dilation.  Question of subtle liver masses up to 15 mm in diameter, but follow-up MRI with no hepatic masses identified.  CT A/P without contrast 12/21/2020 with questionable enlargement of lower uterine segment/cervix, physiologic bilateral ovarian cysts suspected, otherwise no significant findings.  She had follow-up with OB/GYN 12/28/2020 with normal pelvic exam.  It became evident that symptoms seem to be more associated with bowel movements, and she was started on dicyclomine in April 2022 and reported significant improvement in her symptoms when she was last seen in August 2022.  Unfortunately, taking dicyclomine daily did cause some constipation, so she was advised to start MiraLAX daily to help mitigate constipation.    She also has history of intermittent chest pain without typical GERD symptoms, nausea, or vomiting.  She was evaluated by cardiology with no significant findings on heart monitor or echocardiogram.  Symptoms previously resolved after starting omeprazole 20 mg daily.  Today:  Patient reports several month history of early satiety with associated epigastric  discomfort described as a "tightening" sensation postprandially.  Only able to eat about half of her meals.  Denies any typical reflux symptoms.  She is taking omeprazole 20 mg daily.  Denies nausea, vomiting, or dysphagia.  Her early satiety and postprandial epigastric discomfort are worsened by greasy and spicy foods.  Denies any weight loss.  Per chart review, looks like she may be down about 5 pounds since December 2022.  Denies BRBPR or melena.  She is taking 400 mg of ibuprofen about once a week for abdominal pain.  Bowels are moving well. Taking dicyclomine daily which helped significantly with her lower abdominal pain.  She does use MiraLAX as needed, but has not needed this in quite some time.  Past Medical History:  Diagnosis Date   Anxiety    Mental disorder     Past Surgical History:  Procedure Laterality Date   COLONOSCOPY WITH PROPOFOL N/A 12/27/2020    Surgeon: [name], DO;  Nonbleeding internal hemorrhoids, 2 mm polyp in sigmoid colon resected and retrieved (tubular adenoma).  Recommended repeat colonoscopy in 5 years.   POLYPECTOMY  12/27/2020   Procedure: POLYPECTOMY;  Surgeon: [name], DO;  [facility];  Service: Endoscopy;;    Current Outpatient Medications  Medication Sig Dispense Refill   cetirizine (ZYRTEC) 10 MG tablet Take 10 mg by mouth daily.     dicyclomine (BENTYL) 10 MG capsule START WITH 1 CAPSULE BY MOUTH EVERY MORNING AND INCREASE TO 1 CAPSULE UP TO THREE TIMES DAILY IF NEEDED 90 capsule 1   fluticasone (FLONASE) 50 MCG/ACT nasal spray Place into both nostrils.     ibuprofen (ADVIL) 200 MG tablet Take 200 mg  by mouth every 6 (six) hours as needed for mild pain or moderate pain.     Iron, Ferrous Sulfate, 325 (65 Fe) MG TABS Take 325 mg by mouth daily. 30 tablet 11   omeprazole (PRILOSEC) 20 MG capsule TAKE 1 CAPSULE(20 MG) BY MOUTH DAILY BEFORE BREAKFAST 30 capsule 5   Relugolix-Estradiol-Norethind (MYFEMBREE) 40-1-0.5 MG TABS  Take 1 tablet by mouth daily. 30 tablet 6   ondansetron (ZOFRAN) 4 MG tablet Take 1 tablet (4 mg total) by mouth every 6 (six) hours as needed for nausea or vomiting. (Patient not taking: Reported on 12/22/2021) 20 tablet 0   No current facility-administered medications for this visit.    Allergies as of 12/22/2021   (No Known Allergies)    Family History  Problem Relation Age of Onset   Heart attack Father    Cancer Mother        doesn't know what type.    Diabetes Mother    Stroke Mother     Social History   Socioeconomic History   Marital status: Single    Spouse name: Not on file   Number of children: Not on file   Years of education: Not on file   Highest education level: Not on file  Occupational History   Not on file  Tobacco Use   Smoking status: Never   Smokeless tobacco: Never  Vaping Use   Vaping Use: Never used  Substance and Sexual Activity   Alcohol use: Never   Drug use: Never   Sexual activity: Never    Birth control/protection: None  Other Topics Concern   Not on file  Social History Narrative   Not on file   Social Determinants of Health   Financial Resource Strain: Not on file  Food Insecurity: Not on file  Transportation Needs: Not on file  Physical Activity: Not on file  Stress: Not on file  Social Connections: Not on file    Review of Systems: Gen: Denies fever, chills, cold or flulike symptoms, presyncope, syncope. CV: Denies chest pain, palpitations. Resp: Denies dyspnea or cough. GI: See HPI  Heme: See HPI  Physical Exam: BP 114/73    Pulse 82    Temp (!) 96.9 F (36.1 C) (Temporal)    Ht 5\' 6"  (1.676 m)    Wt 225 lb 3.2 oz (102.2 kg)    BMI 36.35 kg/m  General:   Alert and oriented. No distress noted. Pleasant and cooperative.  Head:  Normocephalic and atraumatic. Eyes:  Conjuctiva clear without scleral icterus. Heart:  S1, S2 present without murmurs appreciated. Lungs:  Clear to auscultation bilaterally. No wheezes, rales,  or rhonchi. No distress.  Abdomen:  +BS, soft, non-tender and non-distended. No rebound or guarding. No HSM or masses noted. Msk:  Symmetrical without gross deformities. Normal posture. Extremities:  Without edema. Neurologic:  Alert and  oriented x4 Psych:  Normal mood and affect.    Assessment: 46 year old female with history of atypical chest pain likely secondary to GERD and improved with omeprazole daily, chronic intermittent RLQ abdominal pain likely secondary to IBS with extensive evaluation previously as detailed in HPI, improved with dicyclomine, presenting today for routine 57-month follow-up with chief complaint of upper abdominal discomfort and early satiety.  Early satiety/epigastric pain: Several month history of early satiety with associated epigastric discomfort described as "tightening" postprandially.  Notably, patient is a very difficult historian.  She denies typical reflux symptoms on omeprazole 20 mg daily, nausea, vomiting, dysphagia, BRBPR, melena, or unintentional  weight loss.  Per chart review, looks like she is down about 5 pounds since December 2022.  She does take ibuprofen about once a week for abdominal pain.  Prior ultrasound in 2021 with no evidence of gallstones.  CT without contrast in February 2022 with no significant GI findings.  No history of diabetes.  She has never had an EGD.  Her abdominal exam was benign today.  I have recommended EGD for further evaluation to rule out esophagitis, gastritis, duodenitis, PUD, H. pylori, pyloric stenosis, or other gastric outlet obstruction.   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  Previously with chest pain, evaluated by cardiology with no significant findings on heart monitor or echocardiogram, improved with omeprazole 20 mg daily.  Plan:  Proceed with EGD with propofol with Dr. Abbey Chatters in the near future. The risks, benefits, and alternatives have been discussed with the patient in detail. The patient states  understanding and desires to proceed. ASA 2 Continue omeprazole 20 mg daily 30 minutes before breakfast. Avoid fried, fatty, greasy, spicy, citrus foods. Follow-up after EGD.    [name], PA-C [facility] 12/22/2021

## 2021-12-21 NOTE — Progress Notes (Signed)
Referring Provider: Health, Andre Lefort* Primary Care Physician:  Raiford Simmonds., PA-C Primary GI Physician: Dr. Abbey Chatters   Chief Complaint  Patient presents with   Abdominal Pain    Some upper abd pain. Feels full after not eating much. Bowels moving ok   Gastroesophageal Reflux    Not much    HPI:   Madeline Tucker is a 46 y.o. female presenting today for routine follow-up with chief complaint of upper abdominal pain/early satiety.  She has chronic history of intermittent daily RLQ abdominal pain thought to be secondary to IBS.  Extensive evaluation previously including colonoscopy in February 2022 revealing nonbleeding internal hemorrhoids and 2 mm tubular adenoma with recommendations to repeat colonoscopy in 5 years.  Abdominal ultrasound in October 2021 with normal-appearing gallbladder without biliary dilation.  Question of subtle liver masses up to 15 mm in diameter, but follow-up MRI with no hepatic masses identified.  CT A/P without contrast 12/21/2020 with questionable enlargement of lower uterine segment/cervix, physiologic bilateral ovarian cysts suspected, otherwise no significant findings.  She had follow-up with OB/GYN 12/28/2020 with normal pelvic exam.  It became evident that symptoms seem to be more associated with bowel movements, and she was started on dicyclomine in April 2022 and reported significant improvement in her symptoms when she was last seen in August 2022.  Unfortunately, taking dicyclomine daily did cause some constipation, so she was advised to start MiraLAX daily to help mitigate constipation.    She also has history of intermittent chest pain without typical GERD symptoms, nausea, or vomiting.  She was evaluated by cardiology with no significant findings on heart monitor or echocardiogram.  Symptoms previously resolved after starting omeprazole 20 mg daily.  Today:  Patient reports several month history of early satiety with associated epigastric  discomfort described as a "tightening" sensation postprandially.  Only able to eat about half of her meals.  Denies any typical reflux symptoms.  She is taking omeprazole 20 mg daily.  Denies nausea, vomiting, or dysphagia.  Her early satiety and postprandial epigastric discomfort are worsened by greasy and spicy foods.  Denies any weight loss.  Per chart review, looks like she may be down about 5 pounds since December 2022.  Denies BRBPR or melena.  She is taking 400 mg of ibuprofen about once a week for abdominal pain.  Bowels are moving well. Taking dicyclomine daily which helped significantly with her lower abdominal pain.  She does use MiraLAX as needed, but has not needed this in quite some time.  Past Medical History:  Diagnosis Date   Anxiety    Mental disorder     Past Surgical History:  Procedure Laterality Date   COLONOSCOPY WITH PROPOFOL N/A 12/27/2020    Surgeon: [name], DO;  Nonbleeding internal hemorrhoids, 2 mm polyp in sigmoid colon resected and retrieved (tubular adenoma).  Recommended repeat colonoscopy in 5 years.   POLYPECTOMY  12/27/2020   Procedure: POLYPECTOMY;  Surgeon: [name], DO;  Location: [facility];  Service: Endoscopy;;    Current Outpatient Medications  Medication Sig Dispense Refill   cetirizine (ZYRTEC) 10 MG tablet Take 10 mg by mouth daily.     dicyclomine (BENTYL) 10 MG capsule START WITH 1 CAPSULE BY MOUTH EVERY MORNING AND INCREASE TO 1 CAPSULE UP TO THREE TIMES DAILY IF NEEDED 90 capsule 1   fluticasone (FLONASE) 50 MCG/ACT nasal spray Place into both nostrils.     ibuprofen (ADVIL) 200 MG tablet Take 200 mg  by mouth every 6 (six) hours as needed for mild pain or moderate pain.     Iron, Ferrous Sulfate, 325 (65 Fe) MG TABS Take 325 mg by mouth daily. 30 tablet 11   omeprazole (PRILOSEC) 20 MG capsule TAKE 1 CAPSULE(20 MG) BY MOUTH DAILY BEFORE BREAKFAST 30 capsule 5   Relugolix-Estradiol-Norethind (MYFEMBREE) 40-1-0.5 MG TABS  Take 1 tablet by mouth daily. 30 tablet 6   ondansetron (ZOFRAN) 4 MG tablet Take 1 tablet (4 mg total) by mouth every 6 (six) hours as needed for nausea or vomiting. (Patient not taking: Reported on 12/22/2021) 20 tablet 0   No current facility-administered medications for this visit.    Allergies as of 12/22/2021   (No Known Allergies)    Family History  Problem Relation Age of Onset   Heart attack Father    Cancer Mother        doesn't know what type.    Diabetes Mother    Stroke Mother     Social History   Socioeconomic History   Marital status: Single    Spouse name: Not on file   Number of children: Not on file   Years of education: Not on file   Highest education level: Not on file  Occupational History   Not on file  Tobacco Use   Smoking status: Never   Smokeless tobacco: Never  Vaping Use   Vaping Use: Never used  Substance and Sexual Activity   Alcohol use: Never   Drug use: Never   Sexual activity: Never    Birth control/protection: None  Other Topics Concern   Not on file  Social History Narrative   Not on file   Social Determinants of Health   Financial Resource Strain: Not on file  Food Insecurity: Not on file  Transportation Needs: Not on file  Physical Activity: Not on file  Stress: Not on file  Social Connections: Not on file    Review of Systems: Gen: Denies fever, chills, cold or flulike symptoms, presyncope, syncope. CV: Denies chest pain, palpitations. Resp: Denies dyspnea or cough. GI: See HPI  Heme: See HPI  Physical Exam: BP 114/73    Pulse 82    Temp (!) 96.9 F (36.1 C) (Temporal)    Ht 5\' 6"  (1.676 m)    Wt 225 lb 3.2 oz (102.2 kg)    BMI 36.35 kg/m  General:   Alert and oriented. No distress noted. Pleasant and cooperative.  Head:  Normocephalic and atraumatic. Eyes:  Conjuctiva clear without scleral icterus. Heart:  S1, S2 present without murmurs appreciated. Lungs:  Clear to auscultation bilaterally. No wheezes, rales,  or rhonchi. No distress.  Abdomen:  +BS, soft, non-tender and non-distended. No rebound or guarding. No HSM or masses noted. Msk:  Symmetrical without gross deformities. Normal posture. Extremities:  Without edema. Neurologic:  Alert and  oriented x4 Psych:  Normal mood and affect.    Assessment: 46 year old female with history of atypical chest pain likely secondary to GERD and improved with omeprazole daily, chronic intermittent RLQ abdominal pain likely secondary to IBS with extensive evaluation previously as detailed in HPI, improved with dicyclomine, presenting today for routine 52-month follow-up with chief complaint of upper abdominal discomfort and early satiety.  Early satiety/epigastric pain: Several month history of early satiety with associated epigastric discomfort described as "tightening" postprandially.  Notably, patient is a very difficult historian.  She denies typical reflux symptoms on omeprazole 20 mg daily, nausea, vomiting, dysphagia, BRBPR, melena, or unintentional  weight loss.  Per chart review, looks like she is down about 5 pounds since December 2022.  She does take ibuprofen about once a week for abdominal pain.  Prior ultrasound in 2021 with no evidence of gallstones.  CT without contrast in February 2022 with no significant GI findings.  No history of diabetes.  She has never had an EGD.  Her abdominal exam was benign today.  I have recommended EGD for further evaluation to rule out esophagitis, gastritis, duodenitis, PUD, H. pylori, pyloric stenosis, or other gastric outlet obstruction.   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  Previously with chest pain, evaluated by cardiology with no significant findings on heart monitor or echocardiogram, improved with omeprazole 20 mg daily.  Plan:  Proceed with EGD with propofol with Dr. Abbey Chatters in the near future. The risks, benefits, and alternatives have been discussed with the patient in detail. The patient states  understanding and desires to proceed. ASA 2 Continue omeprazole 20 mg daily 30 minutes before breakfast. Avoid fried, fatty, greasy, spicy, citrus foods. Follow-up after EGD.    [name], PA-C [facility] 12/22/2021

## 2021-12-22 ENCOUNTER — Encounter: Payer: Self-pay | Admitting: Gastroenterology

## 2021-12-22 ENCOUNTER — Other Ambulatory Visit: Payer: Self-pay

## 2021-12-22 ENCOUNTER — Encounter: Payer: Self-pay | Admitting: *Deleted

## 2021-12-22 ENCOUNTER — Ambulatory Visit: Payer: Medicaid Other | Admitting: Gastroenterology

## 2021-12-22 VITALS — BP 114/73 | HR 82 | Temp 96.9°F | Ht 66.0 in | Wt 225.2 lb

## 2021-12-22 DIAGNOSIS — R1013 Epigastric pain: Secondary | ICD-10-CM

## 2021-12-22 DIAGNOSIS — R6881 Early satiety: Secondary | ICD-10-CM

## 2021-12-22 DIAGNOSIS — K219 Gastro-esophageal reflux disease without esophagitis: Secondary | ICD-10-CM

## 2021-12-22 NOTE — Addendum Note (Signed)
Addended by: Cheron Every on: 12/22/2021 09:59 AM   Modules accepted: Orders

## 2021-12-22 NOTE — Patient Instructions (Addendum)
We will arrange for you to have an upper endoscopy in the near future with Dr. Abbey Chatters.  Continue omeprazole 20 mg daily 30 minutes before breakfast.  Avoid fried, fatty, greasy, spicy, citrus foods.  We will follow-up with you in the office after your procedure.  Do not hesitate to call if you have questions or concerns prior to your next visit.  It was a pleasure seeing you again today!  [name], PA-C [facility]

## 2022-01-11 ENCOUNTER — Other Ambulatory Visit (HOSPITAL_COMMUNITY)
Admission: RE | Admit: 2022-01-11 | Discharge: 2022-01-11 | Disposition: A | Discharge status: Home / Self Care | Payer: Medicaid Other | Source: Ambulatory Visit | Attending: Internal Medicine | Admitting: Internal Medicine

## 2022-01-11 DIAGNOSIS — R1013 Epigastric pain: Secondary | ICD-10-CM | POA: Insufficient documentation

## 2022-01-11 DIAGNOSIS — K219 Gastro-esophageal reflux disease without esophagitis: Secondary | ICD-10-CM | POA: Diagnosis present

## 2022-01-11 DIAGNOSIS — R6881 Early satiety: Secondary | ICD-10-CM | POA: Insufficient documentation

## 2022-01-11 LAB — PREGNANCY, URINE: Preg Test, Ur: NEGATIVE

## 2022-01-12 ENCOUNTER — Telehealth: Payer: Self-pay | Admitting: *Deleted

## 2022-01-12 ENCOUNTER — Telehealth: Payer: Self-pay | Admitting: Internal Medicine

## 2022-01-12 NOTE — Telephone Encounter (Signed)
Melanie in endo aware ?

## 2022-01-12 NOTE — Telephone Encounter (Signed)
Received a call from endo. Patient was relying on transportation to get her to and from procedure. No one to stay during procedure.  ?Pt stated she did this last time and her driver came inside and picked her up when was ready. She did not have anyone to stay with her for the procedure.  ?Per melanie in endo if pt driver is going to take responsibility for her after procedure and sign paperwork hen fine but we needed to speak with the driver. ? ?Patient provided a number for [employer] [phone]. Attempted to call but this is for pt's to set up transportation. ?Patient is going to try to figure out the contact information for her driver for [place] to contact them. ?She was not understanding that if she does not have someone then her procedure would be cancelled.  ?

## 2022-01-12 NOTE — Telephone Encounter (Signed)
Patient returned call. [employer] is pick pt up. [place] [phone]. ?I called and was advised they would not be able to be responsible for pt and would not sign anything taking responsibility. ? ?I called pt and made her aware. She said let her see if she can fine someone to go with her before we cancel her. ?

## 2022-01-12 NOTE — Telephone Encounter (Signed)
Pt called to let us know that Carlisle Cater [phone]) will be the one with her for transportation the day of her procedure.  ?

## 2022-01-12 NOTE — Telephone Encounter (Signed)
Called # provided for pt. It is for Madeline Tucker and she did confirm she will be there tomorrow but will be there at 9:15am ?

## 2022-01-13 ENCOUNTER — Encounter (HOSPITAL_COMMUNITY): Payer: Self-pay

## 2022-01-13 ENCOUNTER — Encounter (HOSPITAL_COMMUNITY)
Admission: RE | Disposition: A | Discharge status: Home / Self Care | Payer: Self-pay | Source: Home / Self Care | Attending: Internal Medicine

## 2022-01-13 ENCOUNTER — Ambulatory Visit (HOSPITAL_COMMUNITY): Payer: Medicaid Other | Admitting: Anesthesiology

## 2022-01-13 ENCOUNTER — Ambulatory Visit (HOSPITAL_COMMUNITY)
Admission: RE | Admit: 2022-01-13 | Discharge: 2022-01-13 | Disposition: A | Discharge status: Home / Self Care | Payer: Medicaid Other | Attending: Internal Medicine | Admitting: Internal Medicine

## 2022-01-13 ENCOUNTER — Ambulatory Visit (HOSPITAL_BASED_OUTPATIENT_CLINIC_OR_DEPARTMENT_OTHER): Payer: Medicaid Other | Admitting: Anesthesiology

## 2022-01-13 ENCOUNTER — Other Ambulatory Visit: Payer: Self-pay

## 2022-01-13 DIAGNOSIS — R1013 Epigastric pain: Secondary | ICD-10-CM | POA: Diagnosis present

## 2022-01-13 DIAGNOSIS — K3189 Other diseases of stomach and duodenum: Secondary | ICD-10-CM

## 2022-01-13 DIAGNOSIS — R6881 Early satiety: Secondary | ICD-10-CM | POA: Diagnosis not present

## 2022-01-13 DIAGNOSIS — Z79899 Other long term (current) drug therapy: Secondary | ICD-10-CM | POA: Insufficient documentation

## 2022-01-13 DIAGNOSIS — K2950 Unspecified chronic gastritis without bleeding: Secondary | ICD-10-CM | POA: Insufficient documentation

## 2022-01-13 DIAGNOSIS — K219 Gastro-esophageal reflux disease without esophagitis: Secondary | ICD-10-CM | POA: Diagnosis not present

## 2022-01-13 DIAGNOSIS — R1030 Lower abdominal pain, unspecified: Secondary | ICD-10-CM | POA: Diagnosis not present

## 2022-01-13 DIAGNOSIS — R079 Chest pain, unspecified: Secondary | ICD-10-CM

## 2022-01-13 DIAGNOSIS — K2970 Gastritis, unspecified, without bleeding: Secondary | ICD-10-CM | POA: Diagnosis not present

## 2022-01-13 DIAGNOSIS — R109 Unspecified abdominal pain: Secondary | ICD-10-CM

## 2022-01-13 HISTORY — PX: ESOPHAGOGASTRODUODENOSCOPY (EGD) WITH PROPOFOL: SHX5813

## 2022-01-13 HISTORY — PX: BIOPSY: SHX5522

## 2022-01-13 SURGERY — ESOPHAGOGASTRODUODENOSCOPY (EGD) WITH PROPOFOL
Anesthesia: General

## 2022-01-13 MED ORDER — LACTATED RINGERS IV SOLN: INTRAVENOUS | Status: DC

## 2022-01-13 MED ORDER — PROPOFOL 10 MG/ML IV BOLUS
INTRAVENOUS | Status: DC | PRN
  Administered 2022-01-13: 100 mg via INTRAVENOUS

## 2022-01-13 MED ORDER — LIDOCAINE HCL (CARDIAC) PF 100 MG/5ML IV SOSY
PREFILLED_SYRINGE | INTRAVENOUS | Status: DC | PRN
  Administered 2022-01-13: 50 mg via INTRAVENOUS

## 2022-01-13 MED ORDER — OMEPRAZOLE 40 MG PO CPDR: 40.0000 mg | DELAYED_RELEASE_CAPSULE | Freq: Every day | ORAL | 5 refills | Status: DC

## 2022-01-13 NOTE — Op Note (Signed)
[facility] ?Patient Name: Madeline Tucker ?Procedure Date: 01/13/2022 8:59 AM ?MRN: [id number] ?Date of Birth: 02-Jun-1976 ?Attending MD: Elon Alas. Abbey Chatters , DO ?CSN: [id number] ?Age: 46 ?Admit Type: Outpatient ?Procedure:                Upper GI endoscopy ?Indications:              Epigastric abdominal pain, Heartburn, Early satiety ?Providers:                Elon Alas. Abbey Chatters, DO, [place] Page, [place]                          Risa Grill, Technician ?Referring MD:              ?Medicines:                See the Anesthesia note for documentation of the  ?                          administered medications ?Complications:            No immediate complications. ?Estimated Blood Loss:     Estimated blood loss was minimal. ?Procedure:                Pre-Anesthesia Assessment: ?                          - The anesthesia plan was to use monitored  ?                          anesthesia care (MAC). ?                          After obtaining informed consent, the endoscope was  ?                          passed under direct vision. Throughout the  ?                          procedure, the patient's blood pressure, pulse, and  ?                          oxygen saturations were monitored continuously. The  ?                          GIF-H190 ([id number]) scope was introduced through the  ?                          mouth, and advanced to the second part of duodenum.  ?                          The upper GI endoscopy was accomplished without  ?                          difficulty. The patient tolerated the procedure  ?                          well. ?  Scope In: 9:10:08 AM ?Scope Out: 9:13:00 AM ?Total Procedure Duration: 0 hours 2 minutes 52 seconds  ?Findings: ?     There is no endoscopic evidence of bleeding, areas of erosion,  ?     esophagitis, hiatal hernia, ulcerations or varices in the entire  ?     esophagus. ?     Patchy mild inflammation characterized by erythema was found in the  ?     gastric fundus and in the  gastric antrum. Biopsies were taken with a  ?     cold forceps for Helicobacter pylori testing. ?     The duodenal bulb, first portion of the duodenum and second portion of  ?     the duodenum were normal. ?Impression:               - Gastritis. Biopsied. ?                          - Normal duodenal bulb, first portion of the  ?                          duodenum and second portion of the duodenum. ?Moderate Sedation: ?     Per Anesthesia Care ?Recommendation:           - Patient has a contact number available for  ?                          emergencies. The signs and symptoms of potential  ?                          delayed complications were discussed with the  ?                          patient. Return to normal activities tomorrow.  ?                          Written discharge instructions were provided to the  ?                          patient. ?                          - Resume previous diet. ?                          - Continue present medications. ?                          - Await pathology results. ?                          - Use Prilosec (omeprazole) 40 mg PO daily. ?                          - Return to GI clinic in 4 months. ?Procedure Code(s):        --- Professional --- ?  63875, Esophagogastroduodenoscopy, flexible,  ?                          transoral; with biopsy, single or multiple ?Diagnosis Code(s):        --- Professional --- ?                          K29.70, Gastritis, unspecified, without bleeding ?                          R10.13, Epigastric pain ?                          R12, Heartburn ?                          R68.81, Early satiety ?CPT copyright 2019 American Medical Association. All rights reserved. ?The codes documented in this report are preliminary and upon coder review may  ?be revised to meet current compliance requirements. ?Elon Alas. Abbey Chatters, DO ?Elon Alas. [place], DO ?01/13/2022 9:41:39 AM ?This report has been signed electronically. ?Number of  Addenda: 0 ?

## 2022-01-13 NOTE — Anesthesia Preprocedure Evaluation (Addendum)
Anesthesia Evaluation  ?Patient identified by MRN, date of birth, ID band ?Patient awake ? ? ? ?Reviewed: ?Allergy & Precautions, NPO status , Patient's Chart, lab work & pertinent test results ? ?Airway ?Mallampati: II ? ?TM Distance: >3 FB ?Neck ROM: Full ? ? ? Dental ? ?(+) Dental Advisory Given,  ?  ?Pulmonary ?neg pulmonary ROS,  ?  ?Pulmonary exam normal ?breath sounds clear to auscultation ? ? ? ? ? ? Cardiovascular ?negative cardio ROS ?Normal cardiovascular exam ?Rhythm:Regular Rate:Normal ? ?1. Left ventricular ejection fraction, by estimation, is 60 to 65%. The left ventricle has normal function. The left ventricle has no regional wall motion abnormalities. Left ventricular diastolic parameters were normal.  ??2. Right ventricular systolic function is normal. The right ventricular size is normal. Tricuspid regurgitation signal is inadequate for assessing PA pressure.  ??3. There is a trivial pericardial effusion posterior to the left  ?ventricle.  ??4. The mitral valve is grossly normal. Trivial mitral valve  ?regurgitation.  ??5. The aortic valve is tricuspid. There is mild calcification of the aortic valve. Aortic valve regurgitation is mild.  ??6. The inferior vena cava is normal in size with greater than 50% respiratory variability, suggesting right atrial pressure of 3 mmHg.  ?  ?Neuro/Psych ?PSYCHIATRIC DISORDERS Anxiety negative neurological ROS ?   ? GI/Hepatic ?Neg liver ROS, GERD  Medicated,  ?Endo/Other  ?negative endocrine ROS ? Renal/GU ?negative Renal ROS  ?negative genitourinary ?  ?Musculoskeletal ?negative musculoskeletal ROS ?(+)  ? Abdominal ?  ?Peds ?negative pediatric ROS ?(+)  Hematology ?negative hematology ROS ?(+)   ?Anesthesia Other Findings ? ? Reproductive/Obstetrics ?negative OB ROS ? ?  ? ? ? ? ? ? ? ? ? ? ? ? ? ?  ?  ? ? ? ? ? ? ? ?Anesthesia Physical ?Anesthesia Plan ? ?ASA: 2 ? ?Anesthesia Plan: General  ? ?Post-op Pain Management: Minimal  or no pain anticipated  ? ?Induction: Intravenous ? ?PONV Risk Score and Plan: TIVA ? ?Airway Management Planned: Nasal Cannula and Natural Airway ? ?Additional Equipment:  ? ?Intra-op Plan:  ? ?Post-operative Plan:  ? ?Informed Consent: I have reviewed the patients History and Physical, chart, labs and discussed the procedure including the risks, benefits and alternatives for the proposed anesthesia with the patient or authorized representative who has indicated his/her understanding and acceptance.  ? ? ? ?Dental advisory given ? ?Plan Discussed with: CRNA and Surgeon ? ?Anesthesia Plan Comments:   ? ? ? ? ? ? ?Anesthesia Quick Evaluation ? ?

## 2022-01-13 NOTE — Transfer of Care (Signed)
Immediate Anesthesia Transfer of Care Note ? ?Patient: Madeline Tucker ? ?Procedure(s) Performed: ESOPHAGOGASTRODUODENOSCOPY (EGD) WITH PROPOFOL ?BIOPSY ? ?Patient Location: Endoscopy Unit ? ?Anesthesia Type:General ? ?Level of Consciousness: drowsy ? ?Airway & Oxygen Therapy: Patient Spontanous Breathing ? ?Post-op Assessment: Report given to RN and Post -op Vital signs reviewed and stable ? ?Post vital signs: Reviewed and stable ? ?Last Vitals:  ?Vitals Value Taken Time  ?BP    ?Temp    ?Pulse    ?Resp    ?SpO2 93%   ? ? ?Last Pain:  ?Vitals:  ? 01/13/22 0905  ?TempSrc:   ?PainSc: 5   ?   ? ?Patients Stated Pain Goal: 8 (01/13/22 0755) ? ?Complications: No notable events documented. ?

## 2022-01-13 NOTE — Interval H&P Note (Signed)
History and Physical Interval Note: ? ?01/13/2022 ?8:59 AM ? ?Madeline Tucker  has presented today for surgery, with the diagnosis of epigastric pain, reflux, early satiety.  The various methods of treatment have been discussed with the patient and family. After consideration of risks, benefits and other options for treatment, the patient has consented to  Procedure(s) with comments: ?ESOPHAGOGASTRODUODENOSCOPY (EGD) WITH PROPOFOL (N/A) - 9:15am as a surgical intervention.  The patient's history has been reviewed, patient examined, no change in status, stable for surgery.  I have reviewed the patient's chart and labs.  Questions were answered to the patient's satisfaction.   ? ? ?[name] ? ? ?

## 2022-01-13 NOTE — Anesthesia Postprocedure Evaluation (Signed)
Anesthesia Post Note ? ?Patient: Madeline Tucker ? ?Procedure(s) Performed: ESOPHAGOGASTRODUODENOSCOPY (EGD) WITH PROPOFOL ?BIOPSY ? ?Patient location during evaluation: Endoscopy ?Anesthesia Type: General ?Level of consciousness: awake and alert and oriented ?Pain management: pain level controlled ?Vital Signs Assessment: post-procedure vital signs reviewed and stable ?Respiratory status: spontaneous breathing, nonlabored ventilation and respiratory function stable ?Cardiovascular status: blood pressure returned to baseline and stable ?Postop Assessment: no apparent nausea or vomiting ?Anesthetic complications: no ? ? ?No notable events documented. ? ? ?Last Vitals:  ?Vitals:  ? 01/13/22 0755 01/13/22 0916  ?BP: 136/85 105/65  ?Pulse: 96 98  ?Resp:  (!) 22  ?Temp: 36.8 ?C 36.4 ?C  ?SpO2: 94% 95%  ?  ?Last Pain:  ?Vitals:  ? 01/13/22 0916  ?TempSrc: Axillary  ?PainSc: 0-No pain  ? ? ?  ?  ?  ?  ?  ?  ? ?Tashanna Dolin C Zyiere Rosemond ? ? ? ? ?

## 2022-01-13 NOTE — Discharge Instructions (Addendum)
EGD ?Discharge instructions ?Please read the instructions outlined below and refer to this sheet in the next few weeks. These discharge instructions provide you with general information on caring for yourself after you leave the hospital. Your doctor may also give you specific instructions. While your treatment has been planned according to the most current medical practices available, unavoidable complications occasionally occur. If you have any problems or questions after discharge, please call your doctor. ?ACTIVITY ?You may resume your regular activity but move at a slower pace for the next 24 hours.  ?Take frequent rest periods for the next 24 hours.  ?Walking will help expel (get rid of) the air and reduce the bloated feeling in your abdomen.  ?No driving for 24 hours (because of the anesthesia (medicine) used during the test).  ?You may shower.  ?Do not sign any important legal documents or operate any machinery for 24 hours (because of the anesthesia used during the test).  ?NUTRITION ?Drink plenty of fluids.  ?You may resume your normal diet.  ?Begin with a light meal and progress to your normal diet.  ?Avoid alcoholic beverages for 24 hours or as instructed by your caregiver.  ?MEDICATIONS ?You may resume your normal medications unless your caregiver tells you otherwise.  ?WHAT YOU CAN EXPECT TODAY ?You may experience abdominal discomfort such as a feeling of fullness or ?gas? pains.  ?FOLLOW-UP ?Your doctor will discuss the results of your test with you.  ?SEEK IMMEDIATE MEDICAL ATTENTION IF ANY OF THE FOLLOWING OCCUR: ?Excessive nausea (feeling sick to your stomach) and/or vomiting.  ?Severe abdominal pain and distention (swelling).  ?Trouble swallowing.  ?Temperature over 101? F (37.8? C).  ?Rectal bleeding or vomiting of blood.  ? ?Your EGD revealed mild amount inflammation in your stomach.  I took biopsies of this to rule out infection with a bacteria called H. pylori.  Await pathology results, my  office will contact you. I am going to increase your Omeprazole to 40 mg daily. Follow up with GI in 3-4 months.  ? ? ? ?I hope you have a great rest of your week! ? ?Elon Alas. Abbey Chatters, D.O. ?Gastroenterology and Hepatology ?[facility] ? ? ?OFFICE NOTIFIED VIA VOICEMAIL AND WILL BE IN TOUCH FOR FOLLOW UP APPOINTMENT WITH KRISTEN IN 3 MONTHS ? ?

## 2022-01-18 LAB — SURGICAL PATHOLOGY

## 2022-01-19 ENCOUNTER — Encounter (HOSPITAL_COMMUNITY): Payer: Self-pay | Admitting: Internal Medicine

## 2022-02-14 ENCOUNTER — Encounter: Payer: Self-pay | Admitting: Internal Medicine

## 2022-04-05 ENCOUNTER — Other Ambulatory Visit: Payer: Self-pay | Admitting: Internal Medicine

## 2022-04-24 ENCOUNTER — Ambulatory Visit: Payer: Medicaid Other | Admitting: Gastroenterology

## 2022-05-02 ENCOUNTER — Ambulatory Visit: Payer: Medicaid Other | Admitting: Obstetrics & Gynecology

## 2022-05-02 ENCOUNTER — Encounter: Payer: Self-pay | Admitting: Obstetrics & Gynecology

## 2022-05-02 VITALS — BP 114/77 | HR 88 | Wt 226.0 lb

## 2022-05-02 DIAGNOSIS — N946 Dysmenorrhea, unspecified: Secondary | ICD-10-CM

## 2022-05-02 DIAGNOSIS — N938 Other specified abnormal uterine and vaginal bleeding: Secondary | ICD-10-CM | POA: Diagnosis not present

## 2022-05-02 MED ORDER — MYFEMBREE 40-1-0.5 MG PO TABS: 1.0000 | ORAL_TABLET | Freq: Every day | ORAL | 11 refills | Status: DC

## 2022-05-02 NOTE — Progress Notes (Signed)
Follow up appointment for results: Myfembree response   Chief Complaint  Patient presents with   Follow-up    DUB on Myfembree    Blood pressure 114/77, pulse 88, weight 226 lb (102.5 kg).  Hemoglobin 13.4 today  Patient states her menses is monthly and much lighter than prior to starting the medication No intermenstrual spotting or bleeding Minimal cramping Very happy with the medication  MEDS ordered this encounter: Meds ordered this encounter  Medications   Relugolix-Estradiol-Norethind (MYFEMBREE) 40-1-0.5 MG TABS    Sig: Take 1 tablet by mouth daily.    Dispense:  30 tablet    Refill:  11    Orders for this encounter: No orders of the defined types were placed in this encounter.   Impression + Management Plan   ICD-10-CM   1. DUB (dysfunctional uterine bleeding): Chronic + stable  N93.8    regular menses on Myfembree, "whole lot lighter than before being on medication" hemoglobin 13.4 today, continue until 05/2023 then consider IUD    2. Dysmenorrhea: chronic + stable  N94.6    little to non on the myfembree      Follow Up: Return in about 6 months (around 11/01/2022) for Follow up, with Dr Elonda Husky: Clearence Ped follow up.     All questions were answered.  Past Medical History:  Diagnosis Date   Anxiety    Mental disorder     Past Surgical History:  Procedure Laterality Date   BIOPSY  01/13/2022   Procedure: BIOPSY;  Surgeon: [name], DO;  [facility];  Service: Endoscopy;;   COLONOSCOPY WITH PROPOFOL N/A 12/27/2020    Surgeon: [name], DO;  Nonbleeding internal hemorrhoids, 2 mm polyp in sigmoid colon resected and retrieved (tubular adenoma).  Recommended repeat colonoscopy in 5 years.   ESOPHAGOGASTRODUODENOSCOPY (EGD) WITH PROPOFOL N/A 01/13/2022   Procedure: ESOPHAGOGASTRODUODENOSCOPY (EGD) WITH PROPOFOL;  Surgeon: [name], DO;  [facility];  Service: Endoscopy;  Laterality: N/A;  9:15am   POLYPECTOMY   12/27/2020   Procedure: POLYPECTOMY;  Surgeon: [name], DO;  [facility];  Service: Endoscopy;;    OB History     Gravida  0   Para  0   Term  0   Preterm  0   AB  0   Living  0      SAB  0   IAB  0   Ectopic  0   Multiple  0   Live Births  0           No Known Allergies  Social History   Socioeconomic History   Marital status: Single    Spouse name: Not on file   Number of children: Not on file   Years of education: Not on file   Highest education level: Not on file  Occupational History   Not on file  Tobacco Use   Smoking status: Never   Smokeless tobacco: Never  Vaping Use   Vaping Use: Never used  Substance and Sexual Activity   Alcohol use: Never   Drug use: Never   Sexual activity: Never    Birth control/protection: None  Other Topics Concern   Not on file  Social History Narrative   Not on file   Social Determinants of Health   Financial Resource Strain: Low Risk  (11/22/2020)   Overall Financial Resource Strain (CARDIA)    Difficulty of Paying Living Expenses: Not hard at all  Food  Insecurity: No Food Insecurity (11/22/2020)   Hunger Vital Sign    Worried About Running Out of Food in the Last Year: Never true    Ran Out of Food in the Last Year: Never true  Transportation Needs: No Transportation Needs (11/22/2020)   PRAPARE - Hydrologist (Medical): No    Lack of Transportation (Non-Medical): No  Physical Activity: Insufficiently Active (11/22/2020)   Exercise Vital Sign    Days of Exercise per Week: 2 days    Minutes of Exercise per Session: 10 min  Stress: No Stress Concern Present (11/22/2020)   [place]    Feeling of Stress : Only a little  Social Connections: Socially Isolated (11/22/2020)   Social Connection and Isolation Panel [NHANES]    Frequency of Communication with Friends and Family: Never     Frequency of Social Gatherings with Friends and Family: Never    Attends Religious Services: Never    Marine scientist or Organizations: Yes    Attends Archivist Meetings: Never    Marital Status: Never married    Family History  Problem Relation Age of Onset   Heart attack Father    Cancer Mother        doesn't know what type.    Diabetes Mother    Stroke Mother

## 2022-05-03 ENCOUNTER — Encounter: Payer: Self-pay | Admitting: Internal Medicine

## 2022-05-10 NOTE — Progress Notes (Unsigned)
Referring Provider: Raiford Simmonds., PA-C Primary Care Physician:  Raiford Simmonds., PA-C Primary GI Physician: Dr. Abbey Chatters  No chief complaint on file.   HPI:   Madeline Tucker is a 46 y.o. female with history of atypical chest pain likely secondary to GERD and improved with omeprazole daily, chronic intermittent RLQ abdominal pain likely secondary to IBS with extensive evaluation previously (detailed in prior notes) and improved by dicyclomine, presenting today for follow-up of early satiety, GERD, epigastric abdominal pain.  Last seen in our office 12/22/2021.  She reported several months of early satiety with associated epigastric discomfort described as a "tightening" sensation postprandially, worse with greasy and spicy foods.  Denies typical reflux symptoms, taking omeprazole 20 mg daily.  No other significant GI symptoms.  She is scheduled for an EGD for further evaluation.  EGD 01/13/2022: Gastritis biopsied, normal examined duodenum.  Pathology with patchy chronic gastritis, negative for H. pylori.  Recommended Protonix 40 mg daily.  Today:    Past Medical History:  Diagnosis Date   Anxiety    Mental disorder     Past Surgical History:  Procedure Laterality Date   BIOPSY  01/13/2022   Procedure: BIOPSY;  Surgeon: [name], DO;  [facility];  Service: Endoscopy;;   COLONOSCOPY WITH PROPOFOL N/A 12/27/2020    Surgeon: [name], DO;  Nonbleeding internal hemorrhoids, 2 mm polyp in sigmoid colon resected and retrieved (tubular adenoma).  Recommended repeat colonoscopy in 5 years.   ESOPHAGOGASTRODUODENOSCOPY (EGD) WITH PROPOFOL N/A 01/13/2022   Procedure: ESOPHAGOGASTRODUODENOSCOPY (EGD) WITH PROPOFOL;  Surgeon: [name], DO;  [facility];  Service: Endoscopy;  Laterality: N/A;  9:15am   POLYPECTOMY  12/27/2020   Procedure: POLYPECTOMY;  Surgeon: [name], DO;  [facility];  Service: Endoscopy;;    Current  Outpatient Medications  Medication Sig Dispense Refill   cetirizine (ZYRTEC) 10 MG tablet Take 10 mg by mouth in the morning.     dicyclomine (BENTYL) 10 MG capsule START WITH 1 CAPSULE BY MOUTH EVERY MORNING AND INCREASE TO 1 CAPSULE UP TO THREE TIMES DAILY IF NEEDED (Patient taking differently: Take 10 mg by mouth in the morning.) 90 capsule 1   FEROSUL 325 (65 Fe) MG tablet TAKE 1 TABLET BY MOUTH DAILY 30 tablet 3   ibuprofen (ADVIL) 400 MG tablet Take 400 mg by mouth 2 (two) times daily as needed (pain.).     omeprazole (PRILOSEC) 40 MG capsule Take 1 capsule (40 mg total) by mouth daily. 30 capsule 5   Relugolix-Estradiol-Norethind (MYFEMBREE) 40-1-0.5 MG TABS Take 1 tablet by mouth daily. 30 tablet 11   No current facility-administered medications for this visit.    Allergies as of 05/11/2022   (No Known Allergies)    Family History  Problem Relation Age of Onset   Heart attack Father    Cancer Mother        doesn't know what type.    Diabetes Mother    Stroke Mother     Social History   Socioeconomic History   Marital status: Single    Spouse name: Not on file   Number of children: Not on file   Years of education: Not on file   Highest education level: Not on file  Occupational History   Not on file  Tobacco Use   Smoking status: Never   Smokeless tobacco: Never  Vaping Use   Vaping Use: Never used  Substance  and Sexual Activity   Alcohol use: Never   Drug use: Never   Sexual activity: Never    Birth control/protection: None  Other Topics Concern   Not on file  Social History Narrative   Not on file   Social Determinants of Health   Financial Resource Strain: Low Risk  (11/22/2020)   Overall Financial Resource Strain (CARDIA)    Difficulty of Paying Living Expenses: Not hard at all  Food Insecurity: No Food Insecurity (11/22/2020)   Hunger Vital Sign    Worried About Running Out of Food in the Last Year: Never true    Ran Out of Food in the Last Year:  Never true  Transportation Needs: No Transportation Needs (11/22/2020)   PRAPARE - Hydrologist (Medical): No    Lack of Transportation (Non-Medical): No  Physical Activity: Insufficiently Active (11/22/2020)   Exercise Vital Sign    Days of Exercise per Week: 2 days    Minutes of Exercise per Session: 10 min  Stress: No Stress Concern Present (11/22/2020)   [place]    Feeling of Stress : Only a little  Social Connections: Socially Isolated (11/22/2020)   Social Connection and Isolation Panel [NHANES]    Frequency of Communication with Friends and Family: Never    Frequency of Social Gatherings with Friends and Family: Never    Attends Religious Services: Never    Marine scientist or Organizations: Yes    Attends Archivist Meetings: Never    Marital Status: Never married    Review of Systems: Gen: Denies fever, chills, cold or flulike symptoms, presyncope, syncope. CV: Denies chest pain, palpitations. Resp: Denies dyspnea, cough. GI: See HPI Heme: See HPI  Physical Exam: There were no vitals taken for this visit. General:   Alert and oriented. No distress noted. Pleasant and cooperative.  Head:  Normocephalic and atraumatic. Eyes:  Conjuctiva clear without scleral icterus. Heart:  S1, S2 present without murmurs appreciated. Lungs:  Clear to auscultation bilaterally. No wheezes, rales, or rhonchi. No distress.  Abdomen:  +BS, soft, non-tender and non-distended. No rebound or guarding. No HSM or masses noted. Msk:  Symmetrical without gross deformities. Normal posture. Extremities:  Without edema. Neurologic:  Alert and  oriented x4 Psych:  Normal mood and affect.    Assessment:     Plan:  ***   [name], PA-C [facility] 05/11/2022

## 2022-05-11 ENCOUNTER — Encounter: Payer: Self-pay | Admitting: *Deleted

## 2022-05-11 ENCOUNTER — Ambulatory Visit: Payer: Medicaid Other | Admitting: Gastroenterology

## 2022-05-11 ENCOUNTER — Encounter: Payer: Self-pay | Admitting: Gastroenterology

## 2022-05-11 VITALS — BP 110/66 | HR 96 | Temp 98.1°F | Ht 66.0 in | Wt 227.4 lb

## 2022-05-11 DIAGNOSIS — K219 Gastro-esophageal reflux disease without esophagitis: Secondary | ICD-10-CM | POA: Diagnosis not present

## 2022-05-11 DIAGNOSIS — R1013 Epigastric pain: Secondary | ICD-10-CM | POA: Diagnosis not present

## 2022-05-11 DIAGNOSIS — R1031 Right lower quadrant pain: Secondary | ICD-10-CM | POA: Diagnosis not present

## 2022-05-11 NOTE — Patient Instructions (Signed)
We will arrange for you to have an ultrasound of your gallbladder at [place] taking omeprazole 40 mg daily 30 minutes before breakfast for now.  You may call [facility] to ensure that your recent refill on dicyclomine was not included in the recall batch, then resume dicyclomine as needed.   We will have further recommendations for you following your ultrasound.  [name], PA-C [facility]

## 2022-05-23 ENCOUNTER — Ambulatory Visit (HOSPITAL_COMMUNITY)
Admission: RE | Admit: 2022-05-23 | Discharge: 2022-05-23 | Disposition: A | Discharge status: Home / Self Care | Payer: Medicaid Other | Source: Ambulatory Visit | Attending: Gastroenterology | Admitting: Gastroenterology

## 2022-05-23 DIAGNOSIS — R1013 Epigastric pain: Secondary | ICD-10-CM | POA: Insufficient documentation

## 2022-06-22 ENCOUNTER — Other Ambulatory Visit: Payer: Self-pay | Admitting: Internal Medicine

## 2022-07-04 ENCOUNTER — Other Ambulatory Visit: Payer: Self-pay | Admitting: Internal Medicine

## 2022-07-04 DIAGNOSIS — R109 Unspecified abdominal pain: Secondary | ICD-10-CM

## 2022-07-04 DIAGNOSIS — R079 Chest pain, unspecified: Secondary | ICD-10-CM

## 2022-07-28 ENCOUNTER — Other Ambulatory Visit: Payer: Self-pay | Admitting: Internal Medicine

## 2022-08-11 ENCOUNTER — Other Ambulatory Visit (HOSPITAL_COMMUNITY): Payer: Self-pay | Admitting: *Deleted

## 2022-08-11 DIAGNOSIS — Z1231 Encounter for screening mammogram for malignant neoplasm of breast: Secondary | ICD-10-CM

## 2022-09-15 ENCOUNTER — Ambulatory Visit (HOSPITAL_COMMUNITY)
Admission: RE | Admit: 2022-09-15 | Discharge: 2022-09-15 | Disposition: A | Discharge status: Home / Self Care | Payer: Medicaid Other | Source: Ambulatory Visit | Attending: *Deleted | Admitting: *Deleted

## 2022-09-15 DIAGNOSIS — Z1231 Encounter for screening mammogram for malignant neoplasm of breast: Secondary | ICD-10-CM | POA: Insufficient documentation

## 2022-11-02 ENCOUNTER — Encounter: Payer: Self-pay | Admitting: Obstetrics & Gynecology

## 2022-11-02 ENCOUNTER — Other Ambulatory Visit: Payer: Self-pay | Admitting: Internal Medicine

## 2022-11-02 ENCOUNTER — Ambulatory Visit: Payer: Medicaid Other | Admitting: Obstetrics & Gynecology

## 2022-11-02 VITALS — BP 107/69 | HR 86

## 2022-11-02 DIAGNOSIS — N946 Dysmenorrhea, unspecified: Secondary | ICD-10-CM

## 2022-11-02 DIAGNOSIS — N938 Other specified abnormal uterine and vaginal bleeding: Secondary | ICD-10-CM

## 2022-11-02 MED ORDER — MYFEMBREE 40-1-0.5 MG PO TABS: 1.0000 | ORAL_TABLET | Freq: Every day | ORAL | 11 refills | Status: DC

## 2022-11-02 NOTE — Progress Notes (Signed)
Follow up appointment for results: Myfembree therapy  Chief Complaint  Patient presents with   Follow-up    On myfembree    Blood pressure 107/69, pulse 86, last menstrual period 10/16/2022.  No results found.  No bleeding on the myfembree \ Wants to continue No pain no issues at all  MEDS ordered this encounter: Meds ordered this encounter  Medications   Relugolix-Estradiol-Norethind (MYFEMBREE) 40-1-0.5 MG TABS    Sig: Take 1 tablet by mouth daily.    Dispense:  30 tablet    Refill:  11    Orders for this encounter: No orders of the defined types were placed in this encounter.   Impression + Management Plan   ICD-10-CM   1. DUB (dysfunctional uterine bleeding): Chronic + stable  N93.8     2. Dysmenorrhea: chronic + stable  N94.6       Follow Up: Return in about 1 year (around 11/03/2023) for Follow up, with Dr Elonda Husky.     All questions were answered.  Past Medical History:  Diagnosis Date   Anxiety    Mental disorder     Past Surgical History:  Procedure Laterality Date   BIOPSY  01/13/2022   Procedure: BIOPSY;  Surgeon: [name], DO;  [facility];  Service: Endoscopy;;   COLONOSCOPY WITH PROPOFOL N/A 12/27/2020    Surgeon: [name], DO;  Nonbleeding internal hemorrhoids, 2 mm polyp in sigmoid colon resected and retrieved (tubular adenoma).  Recommended repeat colonoscopy in 5 years.   ESOPHAGOGASTRODUODENOSCOPY (EGD) WITH PROPOFOL N/A 01/13/2022   Surgeon: [name], DO;  Gastritis biopsied, normal examined duodenum.  Pathology with patchy chronic gastritis, negative for H. pylori.   POLYPECTOMY  12/27/2020   Procedure: POLYPECTOMY;  Surgeon: [name], DO;  [facility];  Service: Endoscopy;;    OB History     Gravida  0   Para  0   Term  0   Preterm  0   AB  0   Living  0      SAB  0   IAB  0   Ectopic  0   Multiple  0   Live Births  0           No Known  Allergies  Social History   Socioeconomic History   Marital status: Single    Spouse name: Not on file   Number of children: Not on file   Years of education: Not on file   Highest education level: Not on file  Occupational History   Not on file  Tobacco Use   Smoking status: Never   Smokeless tobacco: Never  Vaping Use   Vaping Use: Never used  Substance and Sexual Activity   Alcohol use: Never   Drug use: Never   Sexual activity: Never    Birth control/protection: None  Other Topics Concern   Not on file  Social History Narrative   Not on file   Social Determinants of Health   Financial Resource Strain: Low Risk  (11/22/2020)   Overall Financial Resource Strain (CARDIA)    Difficulty of Paying Living Expenses: Not hard at all  Food Insecurity: No Food Insecurity (11/22/2020)   Hunger Vital Sign    Worried About Running Out of Food in the Last Year: Never true    Ran Out of Food in the Last Year: Never true  Transportation Needs: No Transportation Needs (11/22/2020)   PRAPARE - Transportation  Lack of Transportation (Medical): No    Lack of Transportation (Non-Medical): No  Physical Activity: Insufficiently Active (11/22/2020)   Exercise Vital Sign    Days of Exercise per Week: 2 days    Minutes of Exercise per Session: 10 min  Stress: No Stress Concern Present (11/22/2020)   [place]    Feeling of Stress : Only a little  Social Connections: Socially Isolated (11/22/2020)   Social Connection and Isolation Panel [NHANES]    Frequency of Communication with Friends and Family: Never    Frequency of Social Gatherings with Friends and Family: Never    Attends Religious Services: Never    Marine scientist or Organizations: Yes    Attends Archivist Meetings: Never    Marital Status: Never married    Family History  Problem Relation Age of Onset   Heart attack Father    Cancer Mother         doesn't know what type.    Diabetes Mother    Stroke Mother

## 2022-11-26 ENCOUNTER — Other Ambulatory Visit: Payer: Self-pay | Admitting: Gastroenterology

## 2022-11-26 DIAGNOSIS — R109 Unspecified abdominal pain: Secondary | ICD-10-CM

## 2022-11-27 ENCOUNTER — Other Ambulatory Visit: Payer: Self-pay | Admitting: Gastroenterology

## 2022-11-27 DIAGNOSIS — R109 Unspecified abdominal pain: Secondary | ICD-10-CM

## 2022-12-13 NOTE — Progress Notes (Unsigned)
Referring Provider: Raiford Simmonds., PA-C Primary Care Physician:  Raiford Simmonds., PA-C Primary GI Physician: Dr. Abbey Chatters  No chief complaint on file.   HPI:   Madeline Tucker is a 47 y.o. female with history of atypical chest pain likely secondary to GERD improved with omeprazole daily, chronic intermittent RLQ abdominal pain likely secondary to IBS with extensive evaluation previously (detailed in prior notes) and improved by dicyclomine, presenting today for routine follow-up. Last seen June 2023.   At the time of her last visit in June 2023, she continued with postprandial "tightening" in the epigastric area if eating greasy or spicy foods, otherwise no abdominal pain.  Had previously tried increasing omeprazole to 40 mg daily, but this did not change her symptoms.  Otherwise, she was eating well had no nausea or vomiting or typical heartburn symptoms.  She had previously had an EGD in March for the same symptoms which showed gastritis, biopsies negative for H. pylori.  Suspected her symptoms are secondary to intermittent GERD flares related to dietary triggers versus biliary etiology.  Plan to obtain RUQ ultrasound and advised to avoid dietary triggers.  RUQ ultrasound was within normal limits.  Offered HIDA scan versus monitoring and avoiding trigger foods.  Patient preferred to monitor.  Today:    Past Medical History:  Diagnosis Date   Anxiety    Mental disorder     Past Surgical History:  Procedure Laterality Date   BIOPSY  01/13/2022   Procedure: BIOPSY;  Surgeon: [name], DO;  [facility];  Service: Endoscopy;;   COLONOSCOPY WITH PROPOFOL N/A 12/27/2020    Surgeon: [name], DO;  Nonbleeding internal hemorrhoids, 2 mm polyp in sigmoid colon resected and retrieved (tubular adenoma).  Recommended repeat colonoscopy in 5 years.   ESOPHAGOGASTRODUODENOSCOPY (EGD) WITH PROPOFOL N/A 01/13/2022   Surgeon: [name], DO;  Gastritis  biopsied, normal examined duodenum.  Pathology with patchy chronic gastritis, negative for H. pylori.   POLYPECTOMY  12/27/2020   Procedure: POLYPECTOMY;  Surgeon: [name], DO;  [facility];  Service: Endoscopy;;    Current Outpatient Medications  Medication Sig Dispense Refill   cetirizine (ZYRTEC) 10 MG tablet Take 10 mg by mouth in the morning.     Cranberry-Vitamin C-Probiotic (AZO CRANBERRY PO) Take by mouth.     dicyclomine (BENTYL) 10 MG capsule START TAKING 1 CAPSULE BY MOUTH EVERY MORNING, THEN INCREASE TO 1 CAPSULE UP TO THREE TIMES DAILY IF NEEDED 90 capsule 1   ferrous sulfate (FEROSUL) 325 (65 FE) MG tablet TAKE 1 TABLET BY MOUTH DAILY 15 tablet 0   ibuprofen (ADVIL) 400 MG tablet Take 400 mg by mouth 2 (two) times daily as needed (pain.).     omeprazole (PRILOSEC) 40 MG capsule TAKE 1 CAPSULE(40 MG) BY MOUTH DAILY 30 capsule 5   Relugolix-Estradiol-Norethind (MYFEMBREE) 40-1-0.5 MG TABS Take 1 tablet by mouth daily. 30 tablet 11   Vitamin D, Ergocalciferol, (DRISDOL) 1.25 MG (50000 UNIT) CAPS capsule Take 50,000 Units by mouth once a week.     No current facility-administered medications for this visit.    Allergies as of 12/14/2022   (No Known Allergies)    Family History  Problem Relation Age of Onset   Heart attack Father    Cancer Mother        doesn't know what type.    Diabetes Mother    Stroke Mother     Social History  Socioeconomic History   Marital status: Single    Spouse name: Not on file   Number of children: Not on file   Years of education: Not on file   Highest education level: Not on file  Occupational History   Not on file  Tobacco Use   Smoking status: Never   Smokeless tobacco: Never  Vaping Use   Vaping Use: Never used  Substance and Sexual Activity   Alcohol use: Never   Drug use: Never   Sexual activity: Never    Birth control/protection: None  Other Topics Concern   Not on file  Social History Narrative    Not on file   Social Determinants of Health   Financial Resource Strain: Low Risk  (11/22/2020)   Overall Financial Resource Strain (CARDIA)    Difficulty of Paying Living Expenses: Not hard at all  Food Insecurity: No Food Insecurity (11/22/2020)   Hunger Vital Sign    Worried About Running Out of Food in the Last Year: Never true    [place] in the Last Year: Never true  Transportation Needs: No Transportation Needs (11/22/2020)   PRAPARE - Hydrologist (Medical): No    Lack of Transportation (Non-Medical): No  Physical Activity: Insufficiently Active (11/22/2020)   Exercise Vital Sign    Days of Exercise per Week: 2 days    Minutes of Exercise per Session: 10 min  Stress: No Stress Concern Present (11/22/2020)   [place]    Feeling of Stress : Only a little  Social Connections: Socially Isolated (11/22/2020)   Social Connection and Isolation Panel [NHANES]    Frequency of Communication with Friends and Family: Never    Frequency of Social Gatherings with Friends and Family: Never    Attends Religious Services: Never    Marine scientist or Organizations: Yes    Attends Archivist Meetings: Never    Marital Status: Never married    Review of Systems: Gen: Denies fever, chills, anorexia. Denies fatigue, weakness, weight loss.  CV: Denies chest pain, palpitations, syncope, peripheral edema, and claudication. Resp: Denies dyspnea at rest, cough, wheezing, coughing up blood, and pleurisy. GI: Denies vomiting blood, jaundice, and fecal incontinence.   Denies dysphagia or odynophagia. Derm: Denies rash, itching, dry skin Psych: Denies depression, anxiety, memory loss, confusion. No homicidal or suicidal ideation.  Heme: Denies bruising, bleeding, and enlarged lymph nodes.  Physical Exam: There were no vitals taken for this visit. General:   Alert and oriented. No  distress noted. Pleasant and cooperative.  Head:  Normocephalic and atraumatic. Eyes:  Conjuctiva clear without scleral icterus. Heart:  S1, S2 present without murmurs appreciated. Lungs:  Clear to auscultation bilaterally. No wheezes, rales, or rhonchi. No distress.  Abdomen:  +BS, soft, non-tender and non-distended. No rebound or guarding. No HSM or masses noted. Msk:  Symmetrical without gross deformities. Normal posture. Extremities:  Without edema. Neurologic:  Alert and  oriented x4 Psych:  Normal mood and affect.    Assessment:     Plan:  ***   [name], PA-C [facility] 12/14/2022

## 2022-12-14 ENCOUNTER — Encounter: Payer: Self-pay | Admitting: Gastroenterology

## 2022-12-14 ENCOUNTER — Ambulatory Visit: Payer: Medicaid Other | Admitting: Gastroenterology

## 2022-12-14 VITALS — BP 104/72 | HR 86 | Temp 97.7°F | Ht 66.0 in | Wt 232.4 lb

## 2022-12-14 DIAGNOSIS — R1013 Epigastric pain: Secondary | ICD-10-CM

## 2022-12-14 DIAGNOSIS — R079 Chest pain, unspecified: Secondary | ICD-10-CM | POA: Diagnosis not present

## 2022-12-14 DIAGNOSIS — R5383 Other fatigue: Secondary | ICD-10-CM

## 2022-12-14 DIAGNOSIS — K219 Gastro-esophageal reflux disease without esophagitis: Secondary | ICD-10-CM | POA: Diagnosis not present

## 2022-12-14 MED ORDER — DEXLANSOPRAZOLE 60 MG PO CPDR: 60.0000 mg | DELAYED_RELEASE_CAPSULE | Freq: Every day | ORAL | 3 refills | Status: DC

## 2022-12-14 NOTE — Patient Instructions (Addendum)
Please have blood work completed at [facility].  Stop omeprazole and start Dexilant 60 mg daily.  I have sent a new prescription to your pharmacy.  Avoid NSAIDs including ibuprofen, Aleve, Advil, BC powders, Goody powders, anything that says "NSAID" on the package.  Please call your cardiologist and arrange a follow-up visit for further evaluation of chest pain.  It was good to see you again today. I am sorry you are not feeling well.   Will plan to see back in 3 months.  Do not hesitate to call sooner if you have questions or concerns.  [name], PA-C [facility]

## 2022-12-15 ENCOUNTER — Telehealth: Payer: Self-pay | Admitting: Internal Medicine

## 2022-12-15 NOTE — Telephone Encounter (Signed)
Pt c/o of Chest Pain: STAT if CP now or developed within 24 hours  1. Are you having CP right now? yes  2. Are you experiencing any other symptoms (ex. SOB, nausea, vomiting, sweating)? no  3. How long have you been experiencing CP? 2 weeks  4. Is your CP continuous or coming and going? Coming and going  5. Have you taken Nitroglycerin? no ?

## 2022-12-15 NOTE — Telephone Encounter (Signed)
Seen by Dr.Ross on 6/22, had echo and Zio monitor.   Saw GI yesterday, told to call and make f/u appointment with cardiology.   Patient tells me she has had intermittent chest pain for over two weeks,could not tell me if anything precipitated this.She describes as aching, said it lasts "not to long", couldn't quantify. Denies SOB,diaphoresis, N/V. She said she does have pain with this in both wrists. Denies carpal tunnel issues. She states Ibuprofen helps pain. I informed her that GI advised her to not use any NSAIDS at visit yesterday. She seemed surprised by that.   We made her a apt with E.Peck,NP on Tuesday 12/19/22 at 0830 in our [place] office. She uses medical transportation and will arrive 15 minutes early to register.   I also advised her if symptoms worsened to go directly to the ER for evaluation, she agrees to do so.

## 2022-12-19 ENCOUNTER — Ambulatory Visit: Payer: Medicaid Other | Attending: Nurse Practitioner | Admitting: Nurse Practitioner

## 2022-12-19 ENCOUNTER — Encounter: Payer: Self-pay | Admitting: Nurse Practitioner

## 2022-12-19 VITALS — BP 114/68 | HR 82 | Ht 67.0 in | Wt 234.6 lb

## 2022-12-19 DIAGNOSIS — Z79899 Other long term (current) drug therapy: Secondary | ICD-10-CM | POA: Diagnosis not present

## 2022-12-19 DIAGNOSIS — E669 Obesity, unspecified: Secondary | ICD-10-CM

## 2022-12-19 DIAGNOSIS — Z8249 Family history of ischemic heart disease and other diseases of the circulatory system: Secondary | ICD-10-CM

## 2022-12-19 DIAGNOSIS — R0789 Other chest pain: Secondary | ICD-10-CM | POA: Diagnosis not present

## 2022-12-19 DIAGNOSIS — Z13220 Encounter for screening for lipoid disorders: Secondary | ICD-10-CM

## 2022-12-19 DIAGNOSIS — R079 Chest pain, unspecified: Secondary | ICD-10-CM

## 2022-12-19 MED ORDER — ACETAMINOPHEN 500 MG PO TABS: 1000.0000 mg | ORAL_TABLET | Freq: Two times a day (BID) | ORAL | Status: AC | PRN

## 2022-12-19 NOTE — Patient Instructions (Addendum)
Medication Instructions:  Tylenol 1,'000mg'$  twice a day as needed  Continue all other medications.     Labwork: FLP - order given today  Reminder:  Nothing to eat or drink after 12 midnight prior to labs. Office will contact with results via phone, letter or mychart.     Testing/Procedures: none  Follow-Up: 6 - 8 weeks    Any Other Special Instructions Will Be Listed Below (If Applicable). Mediterranean diet info sheet given today   If you need a refill on your cardiac medications before your next appointment, please call your pharmacy.

## 2022-12-19 NOTE — Progress Notes (Unsigned)
Cardiology Office Note:    Date:  12/19/2022  ID:  AMBUR PROVINCE, DOB 08-25-1976, MRN [id number]  PCP:  Raiford Simmonds., [place] Providers Cardiologist:  Chalmers Guest, MD     Referring MD: Raiford Simmonds., PA-C   CC: Chest pain  History of Present Illness:    Madeline Tucker is a 47 y.o. female with a hx of the following:   History of chest pain Anxiety, mental disorder Obesity FH of CVD  Patient is a very pleasant 47 year old female with past medical history as mentioned above.  Initially referred to cardiology services by [facility] department for evaluation of chest tightness.  Saw Dr. Harrington Challenger in 2022.  Patient noted feeling chest tightness often, was barely able to walk.  Also noted some dizziness, denied any syncope.  Did note some palpitations, was unable to tell when she had spells.  She admitted to fatigue.  Was staying well-hydrated.  She was not orthostatic on exam.  Echocardiogram was normal.  Monitor was negative for any arrhythmias, rare PACs and PVCs noted, no triggered events.  Today she presents for follow-up for evaluation for chest pain, started about 1 month ago.  Located in the middle of chest, describes a chest tightness, also located along upper arms and shoulders, has been intermittent.  Ibuprofen helps.  Nothing seems to make it worse, stable over time, rates as 5/10 in intensity.  Similar sensation that she had back in 2022. Denies any shortness of breath, palpitations, syncope, presyncope, dizziness, orthopnea, PND, swelling or significant weight changes, acute bleeding, or claudication.  Positive family history of cardiovascular disease.   Past Medical History:  Diagnosis Date   Anxiety    Mental disorder     Past Surgical History:  Procedure Laterality Date   BIOPSY  01/13/2022   Procedure: BIOPSY;  Surgeon: [name], DO;  [facility];  Service: Endoscopy;;   COLONOSCOPY WITH PROPOFOL N/A  12/27/2020    Surgeon: [name], DO;  Nonbleeding internal hemorrhoids, 2 mm polyp in sigmoid colon resected and retrieved (tubular adenoma).  Recommended repeat colonoscopy in 5 years.   ESOPHAGOGASTRODUODENOSCOPY (EGD) WITH PROPOFOL N/A 01/13/2022   Surgeon: [name], DO;  Gastritis biopsied, normal examined duodenum.  Pathology with patchy chronic gastritis, negative for H. pylori.   POLYPECTOMY  12/27/2020   Procedure: POLYPECTOMY;  Surgeon: [name], DO;  [facility];  Service: Endoscopy;;    Current Medications: Current Meds  Medication Sig   acetaminophen (TYLENOL) 500 MG tablet Take 2 tablets (1,000 mg total) by mouth 2 (two) times daily as needed.   Cholecalciferol (VITAMIN D-3) 125 MCG (5000 UT) TABS Take 1 tablet by mouth daily.   Cranberry-Vitamin C-Probiotic (AZO CRANBERRY PO) Take by mouth as needed.   dexlansoprazole (DEXILANT) 60 MG capsule Take 1 capsule (60 mg total) by mouth daily.   dicyclomine (BENTYL) 10 MG capsule START TAKING 1 CAPSULE BY MOUTH EVERY MORNING, THEN INCREASE TO 1 CAPSULE UP TO THREE TIMES DAILY IF NEEDED   ferrous sulfate (FEROSUL) 325 (65 FE) MG tablet TAKE 1 TABLET BY MOUTH DAILY   Relugolix-Estradiol-Norethind (MYFEMBREE) 40-1-0.5 MG TABS Take 1 tablet by mouth daily.     Allergies:   Patient has no known allergies.   Social History   Socioeconomic History   Marital status: Single    Spouse name: Not on file   Number of children: Not on file   Years  of education: Not on file   Highest education level: Not on file  Occupational History   Not on file  Tobacco Use   Smoking status: Never   Smokeless tobacco: Never  Vaping Use   Vaping Use: Never used  Substance and Sexual Activity   Alcohol use: Never   Drug use: Never   Sexual activity: Never    Birth control/protection: None  Other Topics Concern   Not on file  Social History Narrative   Not on file   Social Determinants of Health    Financial Resource Strain: Low Risk  (11/22/2020)   Overall Financial Resource Strain (CARDIA)    Difficulty of Paying Living Expenses: Not hard at all  Food Insecurity: No Food Insecurity (11/22/2020)   Hunger Vital Sign    Worried About Running Out of Food in the Last Year: Never true    [place] in the Last Year: Never true  Transportation Needs: No Transportation Needs (11/22/2020)   PRAPARE - Hydrologist (Medical): No    Lack of Transportation (Non-Medical): No  Physical Activity: Insufficiently Active (11/22/2020)   Exercise Vital Sign    Days of Exercise per Week: 2 days    Minutes of Exercise per Session: 10 min  Stress: No Stress Concern Present (11/22/2020)   [place]    Feeling of Stress : Only a little  Social Connections: Socially Isolated (11/22/2020)   Social Connection and Isolation Panel [NHANES]    Frequency of Communication with Friends and Family: Never    Frequency of Social Gatherings with Friends and Family: Never    Attends Religious Services: Never    Marine scientist or Organizations: Yes    Attends Archivist Meetings: Never    Marital Status: Never married     Family History: The patient's family history includes Cancer in her mother; Diabetes in her mother; Heart attack in her father; Stroke in her mother.  ROS:   Review of Systems  Constitutional: Negative.   HENT: Negative.    Eyes: Negative.   Respiratory: Negative.    Cardiovascular:  Positive for chest pain. Negative for palpitations, orthopnea, claudication, leg swelling and PND.  Gastrointestinal: Negative.   Genitourinary: Negative.   Musculoskeletal:  Positive for joint pain and myalgias. Negative for back pain, falls and neck pain.  Skin: Negative.   Neurological: Negative.   Endo/Heme/Allergies: Negative.   Psychiatric/Behavioral: Negative.      Please see the  history of present illness.    All other systems reviewed and are negative.  EKGs/Labs/Other Studies Reviewed:    The following studies were reviewed today:   EKG:  EKG is ordered today.  The ekg ordered today demonstrates normal sinus rhythm, 84 bpm, T wave abnormality.   Echocardiogram on 06/28/2021:  1. Left ventricular ejection fraction, by estimation, is 60 to 65%. The  left ventricle has normal function. The left ventricle has no regional  wall motion abnormalities. Left ventricular diastolic parameters were  normal.   2. Right ventricular systolic function is normal. The right ventricular  size is normal. Tricuspid regurgitation signal is inadequate for assessing  PA pressure.   3. There is a trivial pericardial effusion posterior to the left  ventricle.   4. The mitral valve is grossly normal. Trivial mitral valve  regurgitation.   5. The aortic valve is tricuspid. There is mild calcification of the  aortic valve. Aortic  valve regurgitation is mild.   6. The inferior vena cava is normal in size with greater than 50%  respiratory variability, suggesting right atrial pressure of 3 mmHg.   Comparison(s): No prior Echocardiogram.  Cardiac monitor on 05/02/2021: Sinus rhythm   64 to 177 bpm  Average HR 95   Rare PAC, PVC    No triggered events.    Recent Labs: No results found for requested labs within last 365 days.  Recent Lipid Panel No results found for: "CHOL", "TRIG", "HDL", "CHOLHDL", "VLDL", "LDLCALC", "LDLDIRECT"   Physical Exam:    VS:  BP 114/68   Pulse 82   Ht '5\' 7"'$  (1.702 m)   Wt 234 lb 9.6 oz (106.4 kg)   LMP 11/24/2022 (Approximate)   SpO2 96%   BMI 36.74 kg/m     Wt Readings from Last 3 Encounters:  12/19/22 234 lb 9.6 oz (106.4 kg)  12/14/22 232 lb 6.4 oz (105.4 kg)  05/11/22 227 lb 6.4 oz (103.1 kg)     GEN: Obese, 47 y.o. female in no acute distress HEENT: Normal NECK: No JVD; No carotid bruits CARDIAC: S1/S2, RRR, no murmurs, rubs,  gallops; 2+ pulses RESPIRATORY:  Clear to auscultation without rales, wheezing or rhonchi  MUSCULOSKELETAL:  No edema; No deformity  SKIN: Warm and dry NEUROLOGIC:  Alert and oriented x 3 PSYCHIATRIC:  Normal affect   ASSESSMENT:    1. Atypical chest pain   2. Family history of cardiovascular disease   3. Screening for hyperlipidemia   4. Medication management   5. Obesity (BMI 30-39.9)    PLAN:    In order of problems listed above:  Atypical chest pain, FH of CVD Atypical symptoms of CP, most likely sounds MSK. Similar in presentation when she saw Dr. Harrington Challenger in 2022. Never has had ischemic evaluation. Has risk factors for CAD including FH and obesity. Will screen for high cholesterol as mentioned below. Recommended Tylenol 1,000 mg BID PRN for her symptoms and warm compresses. If symptoms do not improve by next visit, plan on pursuing CCTA to determine presence of CAD, discussed this with her and she verbalized understanding and was agreeable. Will continue to monitor. Heart healthy diet and regular cardiovascular exercise encouraged. ED precautions discussed.   Screening for hyperlipidemia No recent lipid panel on file. Will screen for hyperlipidemia and will order FLP, has active order for CMP to be checked. Heart healthy diet and regular cardiovascular exercise encouraged.   Obesity BMI today 36.74. Weight loss via diet and exercise encouraged. Discussed the impact being overweight would have on cardiovascular risk. Heart healthy diet and regular cardiovascular exercise encouraged.   4. Disposition: Follow-up with me or APP in 6-8 weeks or sooner if anything changes.   Medication Adjustments/Labs and Tests Ordered: Current medicines are reviewed at length with the patient today.  Concerns regarding medicines are outlined above.  Orders Placed This Encounter  Procedures   Lipid panel   EKG 12-Lead   Meds ordered this encounter  Medications   acetaminophen (TYLENOL) 500 MG  tablet    Sig: Take 2 tablets (1,000 mg total) by mouth 2 (two) times daily as needed.    Begin today 12/19/2022    Patient Instructions  Medication Instructions:  Tylenol 1,'000mg'$  twice a day as needed  Continue all other medications.     Labwork: FLP - order given today  Reminder:  Nothing to eat or drink after 12 midnight prior to labs. Office will contact with results via phone,  letter or mychart.     Testing/Procedures: none  Follow-Up: 6 - 8 weeks    Any Other Special Instructions Will Be Listed Below (If Applicable). Mediterranean diet info sheet given today   If you need a refill on your cardiac medications before your next appointment, please call your pharmacy.    Signed, Finis Bud, NP  12/20/2022 [place]

## 2022-12-22 ENCOUNTER — Other Ambulatory Visit (HOSPITAL_COMMUNITY)
Admission: RE | Admit: 2022-12-22 | Discharge: 2022-12-22 | Disposition: A | Discharge status: Home / Self Care | Payer: Medicaid Other | Source: Ambulatory Visit | Attending: Gastroenterology | Admitting: Gastroenterology

## 2022-12-22 ENCOUNTER — Other Ambulatory Visit (HOSPITAL_COMMUNITY)
Admission: RE | Admit: 2022-12-22 | Discharge: 2022-12-22 | Disposition: A | Discharge status: Home / Self Care | Payer: Medicaid Other | Source: Ambulatory Visit | Attending: Nurse Practitioner | Admitting: Nurse Practitioner

## 2022-12-22 DIAGNOSIS — R079 Chest pain, unspecified: Secondary | ICD-10-CM | POA: Insufficient documentation

## 2022-12-22 DIAGNOSIS — Z13220 Encounter for screening for lipoid disorders: Secondary | ICD-10-CM | POA: Diagnosis present

## 2022-12-22 DIAGNOSIS — Z79899 Other long term (current) drug therapy: Secondary | ICD-10-CM | POA: Diagnosis present

## 2022-12-22 LAB — COMPREHENSIVE METABOLIC PANEL
ALT: 14 U/L (ref 0–44)
AST: 15 U/L (ref 15–41)
Albumin: 3.9 g/dL (ref 3.5–5.0)
Alkaline Phosphatase: 74 U/L (ref 38–126)
Anion gap: 8 (ref 5–15)
BUN: 9 mg/dL (ref 6–20)
CO2: 25 mmol/L (ref 22–32)
Calcium: 8.7 mg/dL — ABNORMAL LOW (ref 8.9–10.3)
Chloride: 105 mmol/L (ref 98–111)
Creatinine, Ser: 0.83 mg/dL (ref 0.44–1.00)
GFR, Estimated: >60 mL/min (ref >60)
Glucose, Bld: 91 mg/dL (ref 70–99)
Potassium: 4.5 mmol/L (ref 3.5–5.1)
Sodium: 138 mmol/L (ref 135–145)
Total Bilirubin: 0.5 mg/dL (ref 0.3–1.2)
Total Protein: 7.2 g/dL (ref 6.5–8.1)

## 2022-12-22 LAB — CBC WITH DIFFERENTIAL/PLATELET
Abs Immature Granulocytes: 0.02 10*3/uL (ref 0.00–0.07)
Basophils Absolute: 0 10*3/uL (ref 0.0–0.1)
Basophils Relative: 1 %
Eosinophils Absolute: 0.1 10*3/uL (ref 0.0–0.5)
Eosinophils Relative: 2 %
HCT: 39.3 % (ref 36.0–46.0)
Hemoglobin: 12.9 g/dL (ref 12.0–15.0)
Immature Granulocytes: 0 %
Lymphocytes Relative: 30 %
Lymphs Abs: 2 10*3/uL (ref 0.7–4.0)
MCH: 29.9 pg (ref 26.0–34.0)
MCHC: 32.8 g/dL (ref 30.0–36.0)
MCV: 91 fL (ref 80.0–100.0)
Monocytes Absolute: 0.3 10*3/uL (ref 0.1–1.0)
Monocytes Relative: 5 %
Neutro Abs: 4.2 10*3/uL (ref 1.7–7.7)
Neutrophils Relative %: 62 %
Platelets: 248 10*3/uL (ref 150–400)
RBC: 4.32 MIL/uL (ref 3.87–5.11)
RDW: 13.2 % (ref 11.5–15.5)
WBC Morphology: REACTIVE
WBC: 6.6 10*3/uL (ref 4.0–10.5)
nRBC: 0 % (ref 0.0–0.2)

## 2022-12-22 LAB — LIPID PANEL
Cholesterol: 131 mg/dL (ref 0–200)
HDL: 47 mg/dL (ref >40)
LDL Cholesterol: 78 mg/dL (ref 0–99)
Total CHOL/HDL Ratio: 2.8 RATIO
Triglycerides: 31 mg/dL (ref <150)
VLDL: 6 mg/dL (ref 0–40)

## 2022-12-22 LAB — TSH: TSH: 1.124 u[IU]/mL (ref 0.350–4.500)

## 2022-12-23 LAB — MISC LABCORP TEST (SEND OUT): Labcorp test code: 81950

## 2022-12-25 ENCOUNTER — Encounter: Payer: Self-pay | Admitting: *Deleted

## 2023-01-09 ENCOUNTER — Ambulatory Visit: Payer: Medicaid Other | Attending: Nurse Practitioner

## 2023-01-09 ENCOUNTER — Encounter: Payer: Self-pay | Admitting: Nurse Practitioner

## 2023-01-09 ENCOUNTER — Telehealth: Payer: Self-pay | Admitting: Nurse Practitioner

## 2023-01-09 ENCOUNTER — Ambulatory Visit: Payer: Medicaid Other | Attending: Nurse Practitioner | Admitting: Nurse Practitioner

## 2023-01-09 ENCOUNTER — Other Ambulatory Visit: Payer: Self-pay | Admitting: Nurse Practitioner

## 2023-01-09 VITALS — BP 104/72 | HR 85 | Ht 66.0 in | Wt 232.6 lb

## 2023-01-09 DIAGNOSIS — E669 Obesity, unspecified: Secondary | ICD-10-CM

## 2023-01-09 DIAGNOSIS — Z13220 Encounter for screening for lipoid disorders: Secondary | ICD-10-CM | POA: Diagnosis not present

## 2023-01-09 DIAGNOSIS — I493 Ventricular premature depolarization: Secondary | ICD-10-CM

## 2023-01-09 DIAGNOSIS — R002 Palpitations: Secondary | ICD-10-CM | POA: Diagnosis not present

## 2023-01-09 NOTE — Patient Instructions (Signed)
Medication Instructions:  Your physician recommends that you continue on your current medications as directed. Please refer to the Current Medication list given to you today.   Labwork: none  Testing/Procedures: ZIO- Long Term Monitor Instructions   Your physician has requested you wear your ZIO patch monitor 3 days.   This is a single patch monitor.  Irhythm supplies one patch monitor per enrollment.  Additional stickers are not available.   Please do not apply patch if you will be having a Nuclear Stress Test, Echocardiogram, Cardiac CT, MRI, or Chest Xray during the time frame you would be wearing the monitor. The patch cannot be worn during these tests.  You cannot remove and re-apply the ZIO XT patch monitor.   Do not shower for the first 24 hours.  You may shower after the first 24 hours.   Press button if you feel a symptom. You will hear a small click.  Record Date, Time and Symptom in the Patient Log Book.   When you are ready to remove patch, follow instructions on last 2 pages of Patient Log Book.  Stick patch monitor onto last page of Patient Log Book.   Place Patient Log Book in [place] box.  Use locking tab on box and tape box closed securely.  The [employer] and [employer] has [employer] on it.  Please place in mailbox as soon as possible.  Your physician should have your test results approximately 7 days after the monitor has been mailed back to [facility].   Call [place] at [phone] if you have questions regarding your ZIO XT patch monitor.  Call them immediately if you see an orange light blinking on your monitor.   If your monitor falls off in less than 4 days contact our Monitor department at [phone].  If your monitor becomes loose or falls off after 4 days call [employer] at [phone] for suggestions on securing your monitor.   Follow-Up:  Your physician recommends that you schedule a follow-up appointment in: 6 months  Any Other  Special Instructions Will Be Listed Below (If Applicable).  You have been referred to Prep Program  If you need a refill on your cardiac medications before your next appointment, please call your pharmacy.

## 2023-01-09 NOTE — Telephone Encounter (Signed)
3 day ZIO, placed and enrolled [id number], EP   PERCERT

## 2023-01-09 NOTE — Progress Notes (Signed)
Cardiology Office Note:    Date:  01/09/2023  ID:  TONNIE WALTON, DOB April 27, 1976, MRN [id number]  PCP:  Raiford Simmonds., PA-C   [place] Providers Cardiologist:  Chalmers Guest, MD     Referring MD: Raiford Simmonds., PA-C   CC: Follow-up for chest pain  History of Present Illness:    Madeline Tucker is a 47 y.o. female with a hx of the following:   History of chest pain Anxiety, mental disorder Obesity FH of CVD  Patient is a very pleasant 47 year old female with past medical history as mentioned above.  Initially referred to cardiology services by [facility] department for evaluation of chest tightness.  Saw Dr. Harrington Challenger in 2022 for chest tightness often, was barely able to walk.  Also noted some dizziness. Did note some palpitations, was unable to tell when she had spells. Was not orthostatic on exam.  Echocardiogram was normal.  Monitor was negative for any arrhythmias, rare PACs and PVCs noted, no triggered events.  12/19/2022 - Saw for chest pain, noted positive family history of cardiovascular disease. Recommended to monitor over time and screen for hyperlipidemia as symptoms were atypical.   01/09/2023 - Chest pain has resolved through use of Ibuprofen. Doing well. Denies any chest pain, shortness of breath, palpitations, syncope, presyncope, dizziness, orthopnea, PND, swelling or significant weight changes, acute bleeding, or claudication. Admits to palpitations and soda consumption.   Past Medical History:  Diagnosis Date   Anxiety    Mental disorder    Obesity (BMI 30-39.9)     Past Surgical History:  Procedure Laterality Date   BIOPSY  01/13/2022   Procedure: BIOPSY;  Surgeon: [name], DO;  [facility];  Service: Endoscopy;;   COLONOSCOPY WITH PROPOFOL N/A 12/27/2020    Surgeon: [name], DO;  Nonbleeding internal hemorrhoids, 2 mm polyp in sigmoid colon resected and retrieved (tubular adenoma).  Recommended  repeat colonoscopy in 5 years.   ESOPHAGOGASTRODUODENOSCOPY (EGD) WITH PROPOFOL N/A 01/13/2022   Surgeon: [name], DO;  Gastritis biopsied, normal examined duodenum.  Pathology with patchy chronic gastritis, negative for H. pylori.   POLYPECTOMY  12/27/2020   Procedure: POLYPECTOMY;  Surgeon: [name], DO;  [facility];  Service: Endoscopy;;    Current Medications: Current Meds  Medication Sig   acetaminophen (TYLENOL) 500 MG tablet Take 2 tablets (1,000 mg total) by mouth 2 (two) times daily as needed.   Cholecalciferol (VITAMIN D-3) 125 MCG (5000 UT) TABS Take 1 tablet by mouth daily.   Cranberry-Vitamin C-Probiotic (AZO CRANBERRY PO) Take by mouth as needed.   dexlansoprazole (DEXILANT) 60 MG capsule Take 1 capsule (60 mg total) by mouth daily.   dicyclomine (BENTYL) 10 MG capsule START TAKING 1 CAPSULE BY MOUTH EVERY MORNING, THEN INCREASE TO 1 CAPSULE UP TO THREE TIMES DAILY IF NEEDED   ferrous sulfate (FEROSUL) 325 (65 FE) MG tablet TAKE 1 TABLET BY MOUTH DAILY   Relugolix-Estradiol-Norethind (MYFEMBREE) 40-1-0.5 MG TABS Take 1 tablet by mouth daily.     Allergies:   Patient has no known allergies.   Social History   Socioeconomic History   Marital status: Single    Spouse name: Not on file   Number of children: Not on file   Years of education: Not on file   Highest education level: Not on file  Occupational History   Not on file  Tobacco Use   Smoking status: Never   Smokeless  tobacco: Never  Vaping Use   Vaping Use: Never used  Substance and Sexual Activity   Alcohol use: Never   Drug use: Never   Sexual activity: Never    Birth control/protection: None  Other Topics Concern   Not on file  Social History Narrative   Not on file   Social Determinants of Health   Financial Resource Strain: Low Risk  (11/22/2020)   Overall Financial Resource Strain (CARDIA)    Difficulty of Paying Living Expenses: Not hard at all  Food Insecurity:  No Food Insecurity (11/22/2020)   Hunger Vital Sign    Worried About Running Out of Food in the Last Year: Never true    Ran Out of Food in the Last Year: Never true  Transportation Needs: No Transportation Needs (11/22/2020)   PRAPARE - Hydrologist (Medical): No    Lack of Transportation (Non-Medical): No  Physical Activity: Insufficiently Active (11/22/2020)   Exercise Vital Sign    Days of Exercise per Week: 2 days    Minutes of Exercise per Session: 10 min  Stress: No Stress Concern Present (11/22/2020)   [place]    Feeling of Stress : Only a little  Social Connections: Socially Isolated (11/22/2020)   Social Connection and Isolation Panel [NHANES]    Frequency of Communication with Friends and Family: Never    Frequency of Social Gatherings with Friends and Family: Never    Attends Religious Services: Never    Marine scientist or Organizations: Yes    Attends Archivist Meetings: Never    Marital Status: Never married     Family History: The patient's family history includes Cancer in her mother; Diabetes in her mother; Heart attack in her father; Stroke in her mother.  ROS:     Please see the history of present illness.    All other systems reviewed and are negative.  EKGs/Labs/Other Studies Reviewed:    The following studies were reviewed today:   EKG:  EKG is not ordered today.    Echocardiogram on 06/28/2021:  1. Left ventricular ejection fraction, by estimation, is 60 to 65%. The  left ventricle has normal function. The left ventricle has no regional  wall motion abnormalities. Left ventricular diastolic parameters were  normal.   2. Right ventricular systolic function is normal. The right ventricular  size is normal. Tricuspid regurgitation signal is inadequate for assessing  PA pressure.   3. There is a trivial pericardial effusion posterior to the  left  ventricle.   4. The mitral valve is grossly normal. Trivial mitral valve  regurgitation.   5. The aortic valve is tricuspid. There is mild calcification of the  aortic valve. Aortic valve regurgitation is mild.   6. The inferior vena cava is normal in size with greater than 50%  respiratory variability, suggesting right atrial pressure of 3 mmHg.   Comparison(s): No prior Echocardiogram.  Cardiac monitor on 05/02/2021: Sinus rhythm   64 to 177 bpm  Average HR 95   Rare PAC, PVC    No triggered events.    Recent Labs: 12/22/2022: ALT 14; BUN 9; Creatinine, Ser 0.83; Hemoglobin 12.9; Platelets 248; Potassium 4.5; Sodium 138; TSH 1.124  Recent Lipid Panel    Component Value Date/Time   CHOL 131 12/22/2022 1027   TRIG 31 12/22/2022 1027   HDL 47 12/22/2022 1027   CHOLHDL 2.8 12/22/2022 1027   VLDL 6  12/22/2022 1027   LDLCALC 78 12/22/2022 1027     Physical Exam:    VS:  BP 104/72   Pulse 85   Ht '5\' 6"'$  (1.676 m)   Wt 232 lb 9.6 oz (105.5 kg)   LMP 11/24/2022 (Approximate)   SpO2 97%   BMI 37.54 kg/m     Wt Readings from Last 3 Encounters:  01/09/23 232 lb 9.6 oz (105.5 kg)  12/19/22 234 lb 9.6 oz (106.4 kg)  12/14/22 232 lb 6.4 oz (105.4 kg)     GEN: Obese, 47 y.o. female in no acute distress HEENT: Normal NECK: No JVD; No carotid bruits CARDIAC: S1/S2, regular rhythm and rate with occasional early beats, no murmurs, rubs, gallops; 2+ pulses RESPIRATORY:  Clear to auscultation without rales, wheezing or rhonchi  MUSCULOSKELETAL:  No edema; No deformity  SKIN: Warm and dry NEUROLOGIC:  Alert and oriented x 3 PSYCHIATRIC:  Normal affect   ASSESSMENT:    1. Palpitations   2. Screening for hyperlipidemia   3. Obesity (BMI 30-39.9)    PLAN:    In order of problems listed above:  Palpitations Noted during interview and early beats noted on exam, appears to be in NSR and highly suspicious of PAC's/PVC's. Does admit to soda consumption. Encouraged her to wean  and reduce soda intake. Heart healthy diet and regular cardiovascular exercise encouraged. Will arrange 3 day Zio monitor. Recent labs WNL.   Screening for hyperlipidemia Recent lipid profile obtained was WNL. Heart healthy diet and regular cardiovascular exercise encouraged.   Obesity BMI today 37.54. Weight loss via diet and exercise encouraged. Discussed the impact being overweight would have on cardiovascular risk. Heart healthy diet and regular cardiovascular exercise encouraged. Will refer to PREP program.   4. Disposition: Follow-up with Dr. Dellia Cloud or APP in 6 months or sooner if anything changes.   Medication Adjustments/Labs and Tests Ordered: Current medicines are reviewed at length with the patient today.  Concerns regarding medicines are outlined above.  Orders Placed This Encounter  Procedures   Amb Referral To Provider Referral Exercise Program (P.R.E.P)   No orders of the defined types were placed in this encounter.   Patient Instructions  Medication Instructions:  Your physician recommends that you continue on your current medications as directed. Please refer to the Current Medication list given to you today.   Labwork: none  Testing/Procedures: ZIO- Long Term Monitor Instructions   Your physician has requested you wear your ZIO patch monitor 3 days.   This is a single patch monitor.  Irhythm supplies one patch monitor per enrollment.  Additional stickers are not available.   Please do not apply patch if you will be having a Nuclear Stress Test, Echocardiogram, Cardiac CT, MRI, or Chest Xray during the time frame you would be wearing the monitor. The patch cannot be worn during these tests.  You cannot remove and re-apply the ZIO XT patch monitor.   Do not shower for the first 24 hours.  You may shower after the first 24 hours.   Press button if you feel a symptom. You will hear a small click.  Record Date, Time and Symptom in the Patient Log Book.   When  you are ready to remove patch, follow instructions on last 2 pages of Patient Log Book.  Stick patch monitor onto last page of Patient Log Book.   Place Patient Log Book in [place] box.  Use locking tab on box and tape box closed securely.  The [facility]  and [place] box has [employer] on it.  Please place in mailbox as soon as possible.  Your physician should have your test results approximately 7 days after the monitor has been mailed back to [facility].   Call [place] at [phone] if you have questions regarding your ZIO XT patch monitor.  Call them immediately if you see an orange light blinking on your monitor.   If your monitor falls off in less than 4 days contact our Monitor department at [phone].  If your monitor becomes loose or falls off after 4 days call [employer] at [phone] for suggestions on securing your monitor.   Follow-Up:  Your physician recommends that you schedule a follow-up appointment in: 6 months  Any Other Special Instructions Will Be Listed Below (If Applicable).  You have been referred to Prep Program  If you need a refill on your cardiac medications before your next appointment, please call your pharmacy.    Signed, Finis Bud, NP  01/09/2023 9:49 AM    [place]

## 2023-01-12 ENCOUNTER — Telehealth: Payer: Self-pay | Admitting: *Deleted

## 2023-01-12 NOTE — Telephone Encounter (Signed)
Contacted regarding PREP Class referral. She does not wish to participate at this time due to fee for course.

## 2023-03-07 ENCOUNTER — Encounter: Payer: Self-pay | Admitting: Gastroenterology

## 2023-03-20 NOTE — Progress Notes (Unsigned)
Referring Provider: Tylene Fantasia., PA-C Primary Care Physician:  Tylene Fantasia., PA-C Primary GI Physician: Dr. Marletta Lor  No chief complaint on file.   HPI:   Madeline Tucker is a 47 y.o. female with history of anxiety, mental disorder, atypical chest pain previously improved with omeprazole, chronic intermittent RUQ abdominal pain s/p extensive evaluation previously and improved by dicyclomine, presenting today for follow-up of atypical chest pain and epigastric pain.   Last seen in our office 12/14/2022 with chief complaint of atypical chest pain which had previously improved with omeprazole, but symptoms now returning.  Reported it felt similar to prior symptoms, but also with exertional chest pain/tightness with associated heart palpitations, shortness of breath, tiredness/fatigue.  Noted prior cardiac workup negative in 2022 (echocardiogram, EKG, heart monitor), but unable to rule out cardiac etiology.  She also reported intermittent epigastric pain triggered by spicy and greasy foods, but also occurring without eating, mild early satiety, but no nausea/vomiting, and had gained weight.  EGD in March 2023 for the same symptoms with gastritis.  RUQ ultrasound last year was normal.  Query loss of response to omeprazole.  Planned to change PPI to Dexilant 60 mg daily and have her follow-up with cardiology.  Also plan to update labs.  Labs completed 2/9 with CBC, CMP essentially normal, TSH normal, vitamin D normal.  Patient saw cardiology 12/19/2022.  Queried MSK etiology of atypical chest pain.  Plan to screen for hyperlipidemia, use Tylenol and warm compresses.  If persistent symptoms, plan for CCTA at follow-up.  She had follow-up on 2/27 and reported resolution of chest pain through use of ibuprofen.  She did report some palpitations and planned to arrange heart monitor.  Heart monitor was completed with no significant abnormalities.   Today:     Past Medical History:  Diagnosis  Date   Anxiety    Mental disorder    Obesity (BMI 30-39.9)     Past Surgical History:  Procedure Laterality Date   BIOPSY  01/13/2022   Procedure: BIOPSY;  Surgeon: Lanelle Bal, DO;  [facility];  Service: Endoscopy;;   COLONOSCOPY WITH PROPOFOL N/A 12/27/2020    Surgeon: Lanelle Bal, DO;  Nonbleeding internal hemorrhoids, 2 mm polyp in sigmoid colon resected and retrieved (tubular adenoma).  Recommended repeat colonoscopy in 5 years.   ESOPHAGOGASTRODUODENOSCOPY (EGD) WITH PROPOFOL N/A 01/13/2022   Surgeon: Lanelle Bal, DO;  Gastritis biopsied, normal examined duodenum.  Pathology with patchy chronic gastritis, negative for H. pylori.   POLYPECTOMY  12/27/2020   Procedure: POLYPECTOMY;  Surgeon: Lanelle Bal, DO;  [facility];  Service: Endoscopy;;    Current Outpatient Medications  Medication Sig Dispense Refill   acetaminophen (TYLENOL) 500 MG tablet Take 2 tablets (1,000 mg total) by mouth 2 (two) times daily as needed.     Cholecalciferol (VITAMIN D-3) 125 MCG (5000 UT) TABS Take 1 tablet by mouth daily.     Cranberry-Vitamin C-Probiotic (AZO CRANBERRY PO) Take by mouth as needed.     dexlansoprazole (DEXILANT) 60 MG capsule Take 1 capsule (60 mg total) by mouth daily. 30 capsule 3   dicyclomine (BENTYL) 10 MG capsule START TAKING 1 CAPSULE BY MOUTH EVERY MORNING, THEN INCREASE TO 1 CAPSULE UP TO THREE TIMES DAILY IF NEEDED 90 capsule 1   ferrous sulfate (FEROSUL) 325 (65 FE) MG tablet TAKE 1 TABLET BY MOUTH DAILY 15 tablet 0   Relugolix-Estradiol-Norethind (MYFEMBREE) 40-1-0.5 MG TABS Take 1 tablet by  mouth daily. 30 tablet 11   No current facility-administered medications for this visit.    Allergies as of 03/22/2023   (No Known Allergies)    Family History  Problem Relation Age of Onset   Heart attack Father    Cancer Mother        doesn't know what type.    Diabetes Mother    Stroke Mother     Social History    Socioeconomic History   Marital status: Single    Spouse name: Not on file   Number of children: Not on file   Years of education: Not on file   Highest education level: Not on file  Occupational History   Not on file  Tobacco Use   Smoking status: Never   Smokeless tobacco: Never  Vaping Use   Vaping Use: Never used  Substance and Sexual Activity   Alcohol use: Never   Drug use: Never   Sexual activity: Never    Birth control/protection: None  Other Topics Concern   Not on file  Social History Narrative   Not on file   Social Determinants of Health   Financial Resource Strain: Low Risk  (11/22/2020)   Overall Financial Resource Strain (CARDIA)    Difficulty of Paying Living Expenses: Not hard at all  Food Insecurity: No Food Insecurity (11/22/2020)   Hunger Vital Sign    Worried About Running Out of Food in the Last Year: Never true    Ran Out of Food in the Last Year: Never true  Transportation Needs: No Transportation Needs (11/22/2020)   PRAPARE - Administrator, Civil Service (Medical): No    Lack of Transportation (Non-Medical): No  Physical Activity: Insufficiently Active (11/22/2020)   Exercise Vital Sign    Days of Exercise per Week: 2 days    Minutes of Exercise per Session: 10 min  Stress: No Stress Concern Present (11/22/2020)   [employer] of Occupational Health - Occupational Stress Questionnaire    Feeling of Stress : Only a little  Social Connections: Socially Isolated (11/22/2020)   Social Connection and Isolation Panel [NHANES]    Frequency of Communication with Friends and Family: Never    Frequency of Social Gatherings with Friends and Family: Never    Attends Religious Services: Never    Database administrator or Organizations: Yes    Attends Banker Meetings: Never    Marital Status: Never married    Review of Systems: Gen: Denies fever, chills, anorexia. Denies fatigue, weakness, weight loss.  CV: Denies chest  pain, palpitations, syncope, peripheral edema, and claudication. Resp: Denies dyspnea at rest, cough, wheezing, coughing up blood, and pleurisy. GI: Denies vomiting blood, jaundice, and fecal incontinence.   Denies dysphagia or odynophagia. Derm: Denies rash, itching, dry skin Psych: Denies depression, anxiety, memory loss, confusion. No homicidal or suicidal ideation.  Heme: Denies bruising, bleeding, and enlarged lymph nodes.  Physical Exam: There were no vitals taken for this visit. General:   Alert and oriented. No distress noted. Pleasant and cooperative.  Head:  Normocephalic and atraumatic. Eyes:  Conjuctiva clear without scleral icterus. Heart:  S1, S2 present without murmurs appreciated. Lungs:  Clear to auscultation bilaterally. No wheezes, rales, or rhonchi. No distress.  Abdomen:  +BS, soft, non-tender and non-distended. No rebound or guarding. No HSM or masses noted. Msk:  Symmetrical without gross deformities. Normal posture. Extremities:  Without edema. Neurologic:  Alert and  oriented x4 Psych:  Normal mood and  affect.    Assessment:     Plan:  ***   Ermalinda Memos, PA-C [facility] 03/22/2023

## 2023-03-22 ENCOUNTER — Ambulatory Visit: Payer: Medicaid Other | Admitting: Gastroenterology

## 2023-03-22 ENCOUNTER — Encounter: Payer: Self-pay | Admitting: Gastroenterology

## 2023-03-22 VITALS — BP 94/69 | HR 96 | Temp 97.7°F | Ht 66.0 in | Wt 238.4 lb

## 2023-03-22 DIAGNOSIS — R079 Chest pain, unspecified: Secondary | ICD-10-CM

## 2023-03-22 DIAGNOSIS — R1013 Epigastric pain: Secondary | ICD-10-CM | POA: Diagnosis not present

## 2023-03-22 NOTE — Patient Instructions (Addendum)
Continue taking Dexilant 60 mg daily.  Continue to avoid known dietary triggers of abdominal pain including spicy foods and greasy foods.  Avoid all NSAID products.  Use Tylenol as needed for menstrual cramping or headaches.  No more than 3000 mg of Tylenol per 24 hours.  We will follow-up with you in 6 months or sooner if needed.  It was good to see you again today!  I'm glad you are feeling better!  Ermalinda Memos, PA-C [facility]

## 2023-04-30 ENCOUNTER — Other Ambulatory Visit (HOSPITAL_COMMUNITY): Payer: Self-pay | Admitting: *Deleted

## 2023-04-30 DIAGNOSIS — M79672 Pain in left foot: Secondary | ICD-10-CM

## 2023-05-03 ENCOUNTER — Ambulatory Visit (HOSPITAL_COMMUNITY)
Admission: RE | Admit: 2023-05-03 | Discharge: 2023-05-03 | Disposition: A | Discharge status: Home / Self Care | Payer: Medicaid Other | Source: Ambulatory Visit | Attending: *Deleted | Admitting: *Deleted

## 2023-05-03 DIAGNOSIS — M79672 Pain in left foot: Secondary | ICD-10-CM | POA: Diagnosis present

## 2023-05-04 ENCOUNTER — Other Ambulatory Visit: Payer: Self-pay | Admitting: Gastroenterology

## 2023-05-04 DIAGNOSIS — K219 Gastro-esophageal reflux disease without esophagitis: Secondary | ICD-10-CM

## 2023-05-04 DIAGNOSIS — R079 Chest pain, unspecified: Secondary | ICD-10-CM

## 2023-05-05 ENCOUNTER — Other Ambulatory Visit: Payer: Self-pay | Admitting: Gastroenterology

## 2023-05-05 DIAGNOSIS — R109 Unspecified abdominal pain: Secondary | ICD-10-CM

## 2023-05-27 ENCOUNTER — Other Ambulatory Visit: Payer: Self-pay | Admitting: Obstetrics & Gynecology

## 2023-07-13 ENCOUNTER — Ambulatory Visit (INDEPENDENT_AMBULATORY_CARE_PROVIDER_SITE_OTHER): Payer: Medicaid Other | Admitting: Internal Medicine

## 2023-07-13 ENCOUNTER — Encounter: Payer: Self-pay | Admitting: Internal Medicine

## 2023-07-13 VITALS — BP 118/80 | HR 86 | Ht 66.0 in | Wt 229.0 lb

## 2023-07-13 DIAGNOSIS — I493 Ventricular premature depolarization: Secondary | ICD-10-CM | POA: Diagnosis not present

## 2023-07-15 IMAGING — MG MM DIGITAL SCREENING BILAT W/ TOMO AND CAD
6 of 10 series · 6 of 30 positions shown · non-contrast
Comparison: Previous exam(s).

ACR Breast Density Category a: The breast tissue is almost entirely
fatty.

CLINICAL DATA: Screening.

EXAM:
DIGITAL SCREENING BILATERAL MAMMOGRAM WITH TOMOSYNTHESIS AND CAD
TECHNIQUE: Bilateral screening digital craniocaudal and mediolateral oblique
mammograms were obtained. Bilateral screening digital breast
tomosynthesis was performed. The images were evaluated with
computer-aided detection.

[L CC synth-2D]
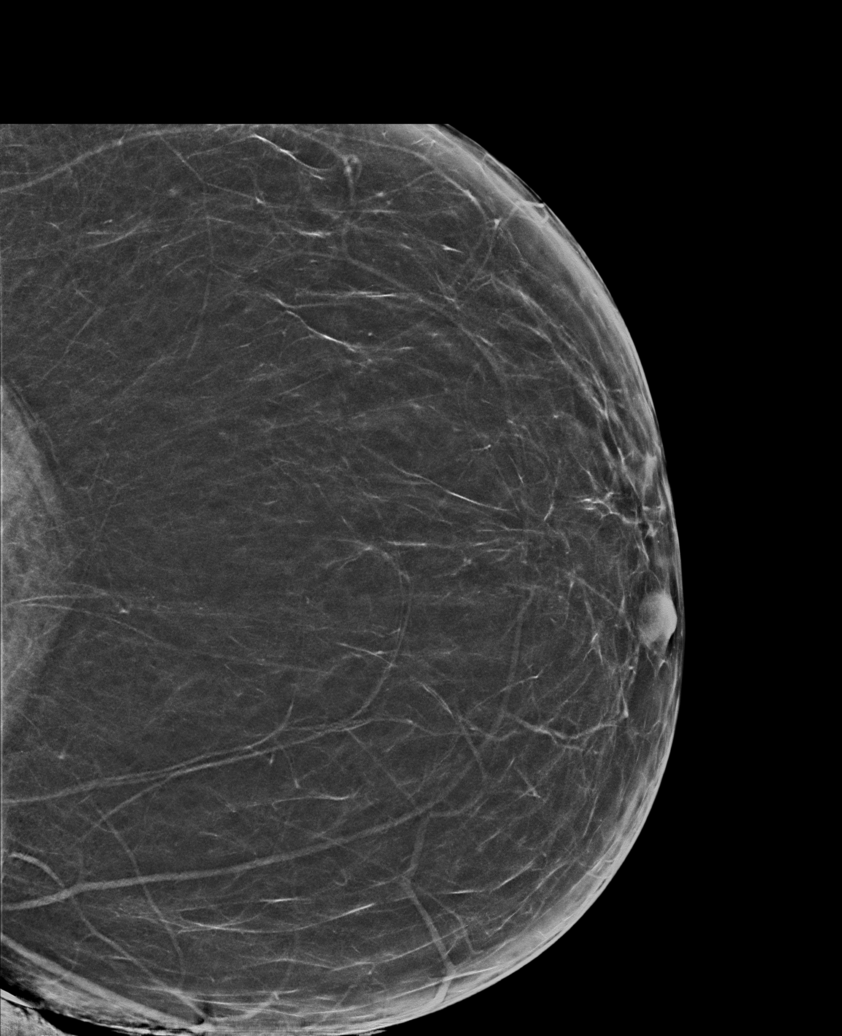

[R CV synth-2D]
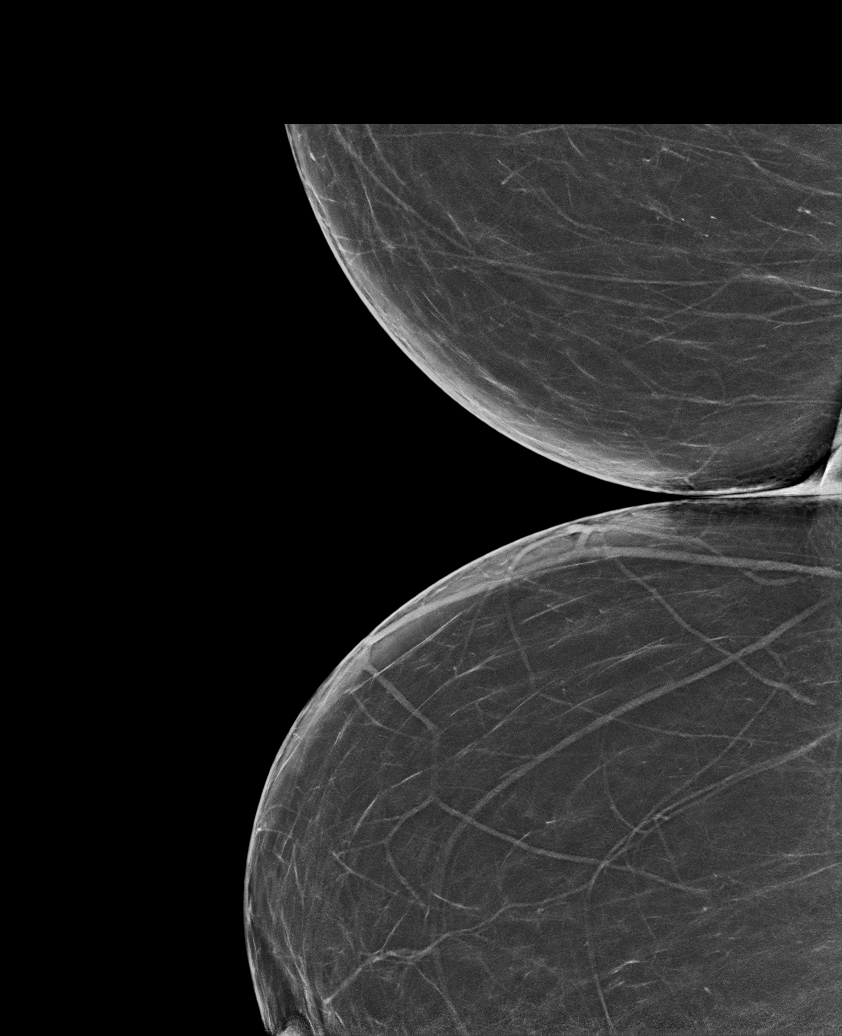

[R CC synth-2D]
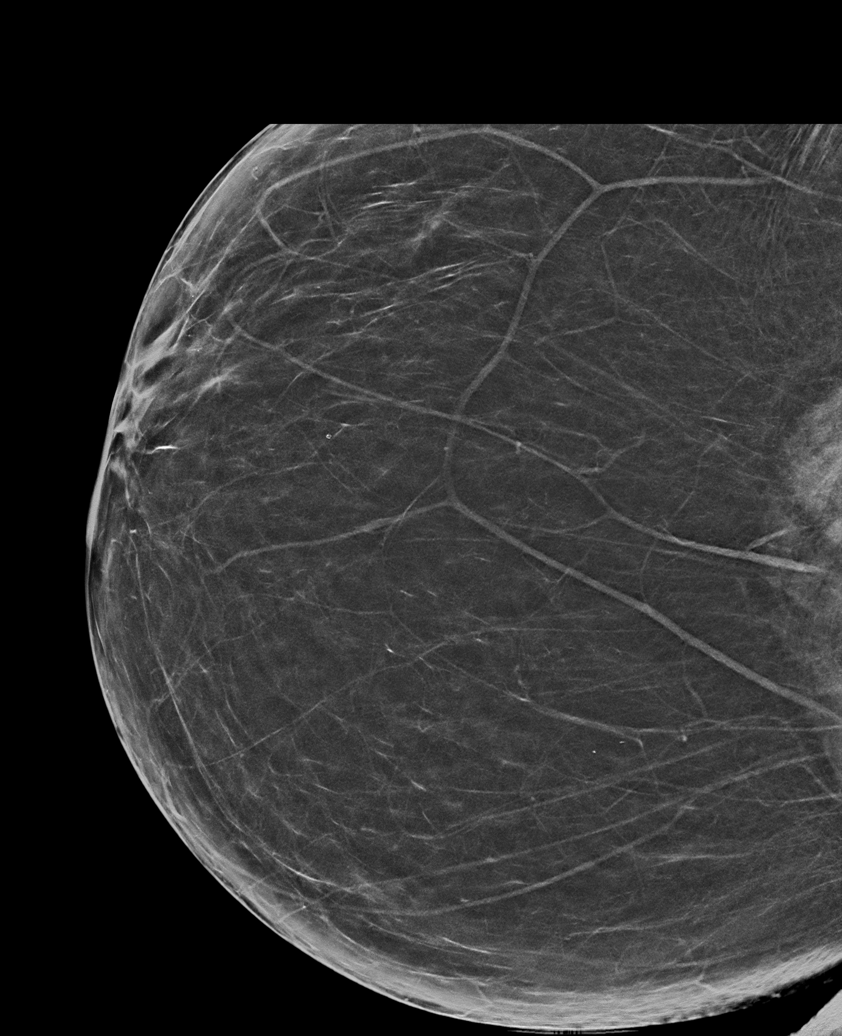

[L MLO synth-2D]
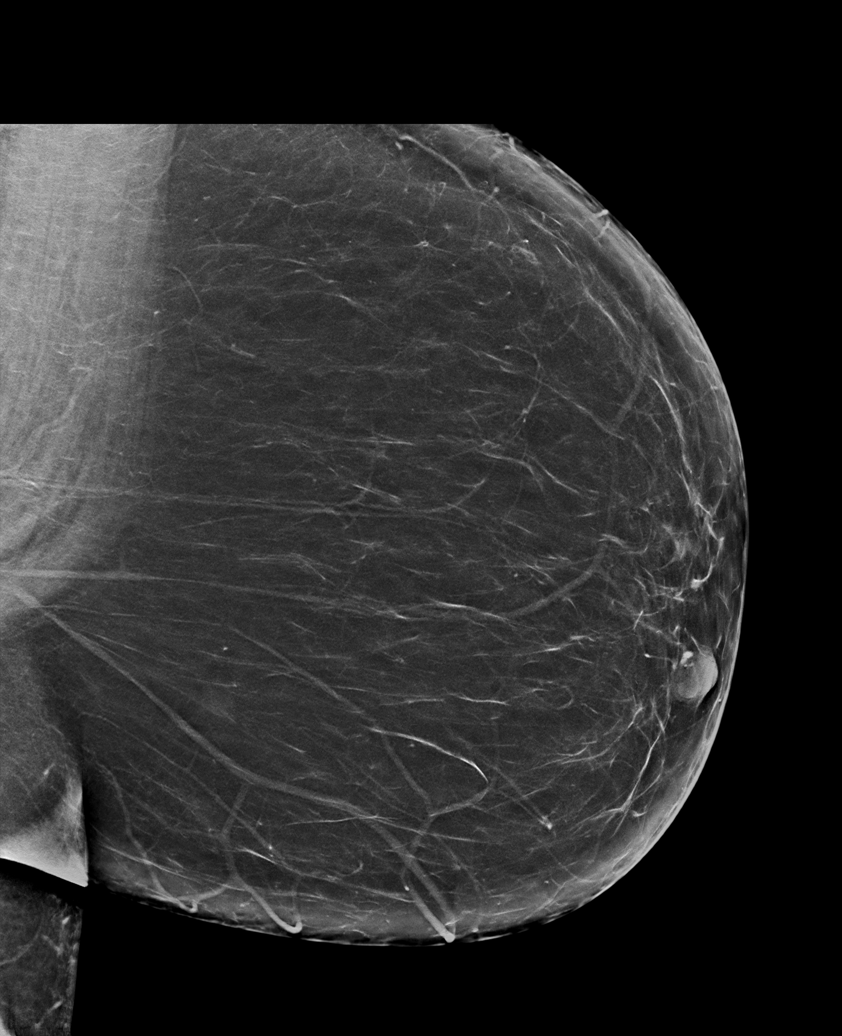

[R MLO synth-2D]
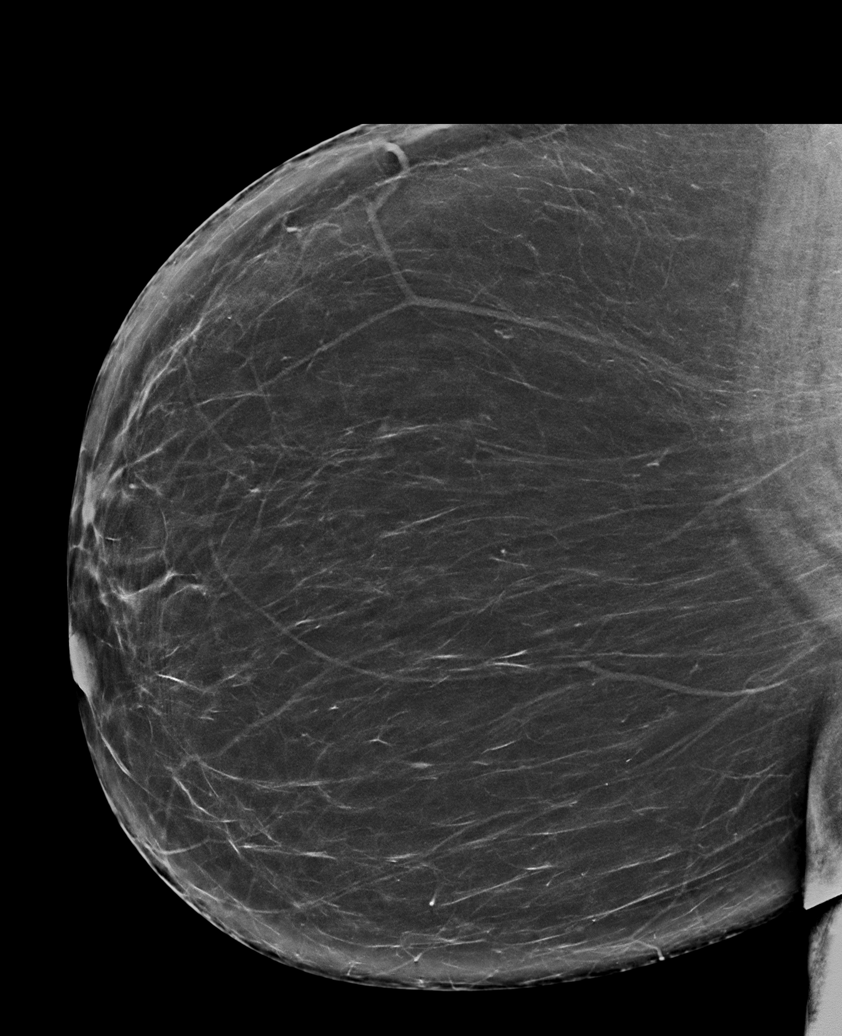

[R MLO tomo · tomo slice 43/85.0]
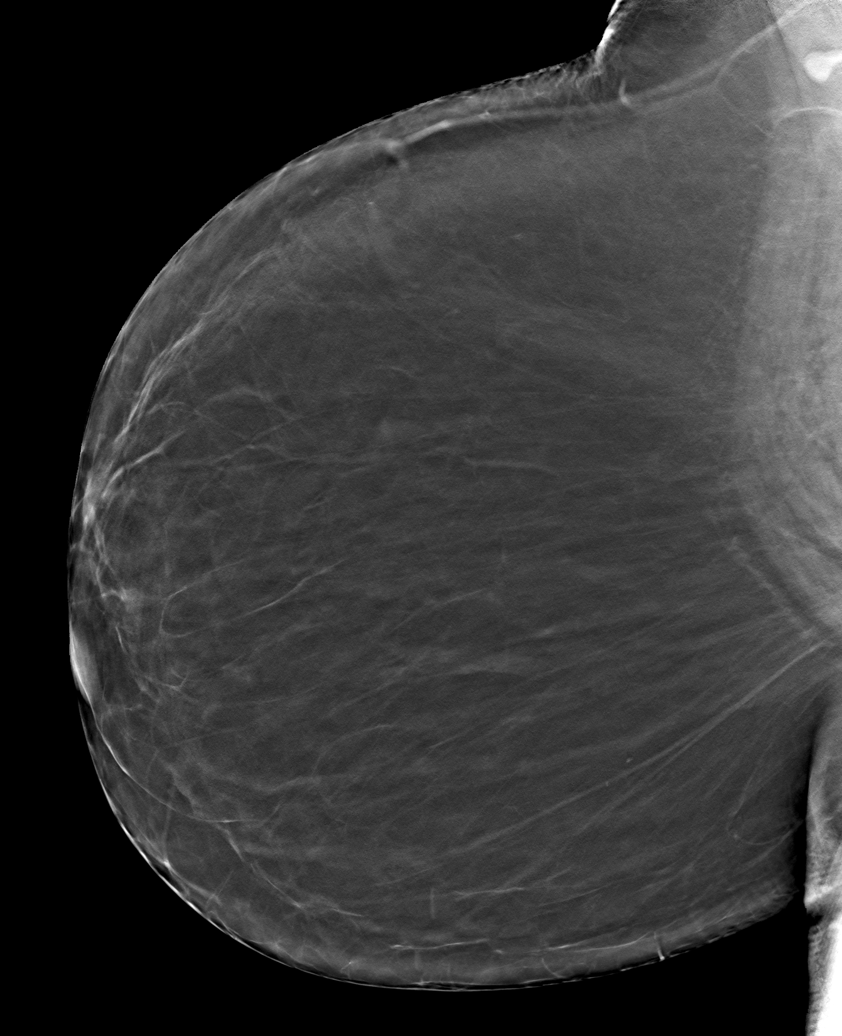

[6 of 30 positions shown; findings below may reference images not displayed]

FINDINGS: There are no findings suspicious for malignancy.
IMPRESSION: No mammographic evidence of malignancy. A result letter of this
screening mammogram will be mailed directly to the patient.

RECOMMENDATION:
Screening mammogram in one year. (Code:0E-3-N98)

BI-RADS CATEGORY  1: Negative.

## 2023-07-15 NOTE — Progress Notes (Signed)
Erroneous encounter - please disregard.

## 2023-08-17 ENCOUNTER — Other Ambulatory Visit (HOSPITAL_COMMUNITY): Payer: Self-pay | Admitting: *Deleted

## 2023-08-17 DIAGNOSIS — Z1231 Encounter for screening mammogram for malignant neoplasm of breast: Secondary | ICD-10-CM

## 2023-08-28 ENCOUNTER — Encounter: Payer: Self-pay | Admitting: Gastroenterology

## 2023-09-10 NOTE — Progress Notes (Unsigned)
Cardiology Office Note   Date:  09/12/2023   ID:  Madeline, Tucker Jul 13, 1976, MRN [id number]  PCP:  Madeline Tucker., PA-C  Cardiologist:   Madeline Pates, MD   Patient presents for follow up of CP    History of Present Illness: Madeline Tucker is a 47 y.o. female with a history of chest pain, obesity, anxiety   I saw the pt in 2022 Echo normal  Monitor negative for isgnficant arrhythmias     She was seen by Dr Jenene Slicker in Feb 2024.  Episodes of CP   Relieved by ibuprofen   She was told to take tylenol    Since seen she gets some chest tightness with stress   Does not get at other times   Breathing is OK    Still having some heart racing  Pt says she is walking more   Diet  Tries to eat once per day   Eats different things   Tries to eat veggies, mediterranean diet She is drinking more juices instead of sodas      Current Meds  Medication Sig   acetaminophen (TYLENOL) 500 MG tablet Take 2 tablets (1,000 mg total) by mouth 2 (two) times daily as needed.   Cholecalciferol (VITAMIN D-3) 125 MCG (5000 UT) TABS Take 1 tablet by mouth daily.   Cranberry-Vitamin C (AZO CRANBERRY URINARY TRACT PO) Take 2 capsules by mouth daily.   dexlansoprazole (DEXILANT) 60 MG capsule TAKE 1 CAPSULE(60 MG) BY MOUTH DAILY   dicyclomine (BENTYL) 10 MG capsule TAKE 1 CAPSULE BY MOUTH EVERY MORNING AND THEN INCREASE TO 1 CAPSULE BY MOUTH THREE TIMES DAILY AS NEEDED   ferrous sulfate (FEROSUL) 325 (65 FE) MG tablet TAKE 1 TABLET BY MOUTH DAILY   MYFEMBREE 40-1-0.5 MG TABS TAKE 1 TABLET BY MOUTH DAILY     Allergies:   Patient has no known allergies.   Past Medical History:  Diagnosis Date   Anxiety    Mental disorder    Obesity (BMI 30-39.9)     Past Surgical History:  Procedure Laterality Date   BIOPSY  01/13/2022   Procedure: BIOPSY;  Surgeon: Lanelle Bal, DO;  [facility];  Service: Endoscopy;;   COLONOSCOPY WITH PROPOFOL N/A 12/27/2020    Surgeon: Lanelle Bal, DO;  Nonbleeding internal hemorrhoids, 2 mm polyp in sigmoid colon resected and retrieved (tubular adenoma).  Recommended repeat colonoscopy in 5 years.   ESOPHAGOGASTRODUODENOSCOPY (EGD) WITH PROPOFOL N/A 01/13/2022   Surgeon: Lanelle Bal, DO;  Gastritis biopsied, normal examined duodenum.  Pathology with patchy chronic gastritis, negative for H. pylori.   POLYPECTOMY  12/27/2020   Procedure: POLYPECTOMY;  Surgeon: Lanelle Bal, DO;  [facility];  Service: Endoscopy;;     Social History:  The patient  reports that she has never smoked. She has never used smokeless tobacco. She reports that she does not drink alcohol and does not use drugs.   Family History:  The patient's family history includes Cancer in her mother; Diabetes in her mother; Heart attack in her father; Stroke in her mother.    ROS:  Please see the history of present illness. All other systems are reviewed and  Negative to the above problem except as noted.    PHYSICAL EXAM: VS:  BP 106/74 (BP Location: Left Arm, Patient Position: Sitting, Cuff Size: Normal)   Pulse 87   Ht 5\' 7"  (1.702 m)   Wt 238 lb 9.6 oz (  108.2 kg)   SpO2 98%   BMI 37.37 kg/m   GEN: Obese 47 yo  in no acute distress  HEENT: normal  Neck: no JVD, no carotid bruits Cardiac: RRR; no murmur  No LE edema  Respiratory:  clear to auscultation bilaterally GI: soft, nontender, obese    EKG:  EKG is not ordered today.  Monitor   Feb 2024  IMpression:   SR   56 to 156 bpm  Average HR 90 bpm   Rare PAC, PVC.    Triggered events corresponded to SR   Patient had a min HR of 56 bpm, max HR of 156 bpm, and avg HR of 90 bpm. Predominant underlying rhythm was Sinus Rhythm. Isolated SVEs were rare (<1.0%), and no SVE Couplets or SVE Triplets were present. Isolated VEs were rare (<1.0%), and no VE Couplets  or VE Triplets were present.   Echo 2022 1. Left ventricular ejection fraction, by estimation, is 60 to 65%. The  left  ventricle has normal function. The left ventricle has no regional  wall motion abnormalities. Left ventricular diastolic parameters were  normal.   2. Right ventricular systolic function is normal. The right ventricular  size is normal. Tricuspid regurgitation signal is inadequate for assessing  PA pressure.   3. There is a trivial pericardial effusion posterior to the left  ventricle.   4. The mitral valve is grossly normal. Trivial mitral valve  regurgitation.   5. The aortic valve is tricuspid. There is mild calcification of the  aortic valve. Aortic valve regurgitation is mild.   6. The inferior vena cava is normal in size with greater than 50%  respiratory variability, suggesting right atrial pressure of 3 mmHg.   Comparison(s): No prior Echocardiogram.   Cardiac monitor on 05/02/2021: Sinus rhythm   64 to 177 bpm  Average HR 95   Rare PAC, PVC    No triggered events.   Lipid Panel    Component Value Date/Time   CHOL 131 12/22/2022 1027   TRIG 31 12/22/2022 1027   HDL 47 12/22/2022 1027   CHOLHDL 2.8 12/22/2022 1027   VLDL 6 12/22/2022 1027   LDLCALC 78 12/22/2022 1027      Wt Readings from Last 3 Encounters:  09/12/23 238 lb 9.6 oz (108.2 kg)  07/13/23 229 lb (103.9 kg)  03/22/23 238 lb 6.4 oz (108.1 kg)      ASSESSMENT AND PLAN:  1  Chest pain   Atypical   SHe has some chest tightness  with mental stress  Dies not have at other times   Esp, not with physical activity Follow  2   Palpitations   Pt denies   3  Obesity  Reviewed diet   Veggies, proteins.  Cut out juices  Keep active   Walk a few miles per day    4  HCM  LDL 78, HDL 47     Will be available as needed for follow up    Current medicines are reviewed at length with the patient today.  The patient does not have concerns regarding medicines.  Signed, Madeline Pates, MD  09/12/2023 9:04 AM    [facility] [address] [place], [place], [place]  [address] Phone: [phone]; Fax:  [phone]

## 2023-09-12 ENCOUNTER — Ambulatory Visit: Payer: Medicaid Other | Attending: Internal Medicine | Admitting: Internal Medicine

## 2023-09-12 ENCOUNTER — Encounter: Payer: Self-pay | Admitting: Internal Medicine

## 2023-09-12 VITALS — BP 106/74 | HR 87 | Ht 67.0 in | Wt 238.6 lb

## 2023-09-12 DIAGNOSIS — R079 Chest pain, unspecified: Secondary | ICD-10-CM

## 2023-09-12 NOTE — Patient Instructions (Signed)
Medication Instructions:  Your physician recommends that you continue on your current medications as directed. Please refer to the Current Medication list given to you today.  *If you need a refill on your cardiac medications before your next appointment, please call your pharmacy*   Lab Work: NONE   If you have labs (blood work) drawn today and your tests are completely normal, you will receive your results only by: MyChart Message (if you have MyChart) OR A paper copy in the mail If you have any lab test that is abnormal or we need to change your treatment, we will call you to review the results.   Testing/Procedures: NONE    Follow-Up: At [facility], you and your health needs are our priority.  As part of our continuing mission to provide you with exceptional heart care, we have created designated Provider Care Teams.  These Care Teams include your primary Cardiologist (physician) and Advanced Practice Providers (APPs -  Physician Assistants and Nurse Practitioners) who all work together to provide you with the care you need, when you need it.  We recommend signing up for the patient portal called "MyChart".  Sign up information is provided on this After Visit Summary.  MyChart is used to connect with patients for Virtual Visits (Telemedicine).  Patients are able to view lab/test results, encounter notes, upcoming appointments, etc.  Non-urgent messages can be sent to your provider as well.   To learn more about what you can do with MyChart, go to [email or web address].    Your next appointment:    As Needed   Provider:   You may see Dietrich Pates, MD or one of the following Advanced Practice Providers on your designated Care Team:   Randall An, PA-C  Jacolyn Reedy, PA-C     Other Instructions Thank you for choosing [facility]!

## 2023-09-18 NOTE — Progress Notes (Unsigned)
Referring Provider: Tylene Fantasia., PA-C Primary Care Physician:  Tylene Fantasia., PA-C Primary GI Physician: Dr. Marletta Lor  No chief complaint on file.   HPI:   Madeline Tucker is a 47 y.o. female  with history of anxiety, mental disorder, atypical chest pain previously improved with PPI but had recurrence earlier suspected to be MSK etiology and improved with ibuprofen, chronic intermittent RUQ abdominal pain s/p extensive evaluation previously and improved by dicyclomine, presenting today for follow-up.   Last seen in the office 03/22/2023.  Report epigastric pain had improved with switching omeprazole to Dexilant 60 mg daily, limiting ibuprofen, and walking more.  She continues to take dicyclomine once a day.  Overall, suspected symptoms were related to gastritis in the setting of NSAIDs.  Anxiety also contributing.  She was advised to continue current medications, avoid known dietary triggers, avoid NSAIDs, follow-up in 6 months.  Today:   Past Medical History:  Diagnosis Date   Anxiety    Mental disorder    Obesity (BMI 30-39.9)     Past Surgical History:  Procedure Laterality Date   BIOPSY  01/13/2022   Procedure: BIOPSY;  Surgeon: Lanelle Bal, DO;  [facility];  Service: Endoscopy;;   COLONOSCOPY WITH PROPOFOL N/A 12/27/2020    Surgeon: Lanelle Bal, DO;  Nonbleeding internal hemorrhoids, 2 mm polyp in sigmoid colon resected and retrieved (tubular adenoma).  Recommended repeat colonoscopy in 5 years.   ESOPHAGOGASTRODUODENOSCOPY (EGD) WITH PROPOFOL N/A 01/13/2022   Surgeon: Lanelle Bal, DO;  Gastritis biopsied, normal examined duodenum.  Pathology with patchy chronic gastritis, negative for H. pylori.   POLYPECTOMY  12/27/2020   Procedure: POLYPECTOMY;  Surgeon: Lanelle Bal, DO;  [facility];  Service: Endoscopy;;    Current Outpatient Medications  Medication Sig Dispense Refill   acetaminophen (TYLENOL) 500 MG tablet  Take 2 tablets (1,000 mg total) by mouth 2 (two) times daily as needed.     Cholecalciferol (VITAMIN D-3) 125 MCG (5000 UT) TABS Take 1 tablet by mouth daily.     Cranberry-Vitamin C (AZO CRANBERRY URINARY TRACT PO) Take 2 capsules by mouth daily.     dexlansoprazole (DEXILANT) 60 MG capsule TAKE 1 CAPSULE(60 MG) BY MOUTH DAILY 30 capsule 5   dicyclomine (BENTYL) 10 MG capsule TAKE 1 CAPSULE BY MOUTH EVERY MORNING AND THEN INCREASE TO 1 CAPSULE BY MOUTH THREE TIMES DAILY AS NEEDED 270 capsule 3   ferrous sulfate (FEROSUL) 325 (65 FE) MG tablet TAKE 1 TABLET BY MOUTH DAILY 15 tablet 0   MYFEMBREE 40-1-0.5 MG TABS TAKE 1 TABLET BY MOUTH DAILY 30 tablet 11   No current facility-administered medications for this visit.    Allergies as of 09/20/2023   (No Known Allergies)    Family History  Problem Relation Age of Onset   Heart attack Father    Cancer Mother        doesn't know what type.    Diabetes Mother    Stroke Mother     Social History   Socioeconomic History   Marital status: Single    Spouse name: Not on file   Number of children: Not on file   Years of education: Not on file   Highest education level: Not on file  Occupational History   Not on file  Tobacco Use   Smoking status: Never   Smokeless tobacco: Never  Vaping Use   Vaping status: Never Used  Substance and Sexual Activity  Alcohol use: Never   Drug use: Never   Sexual activity: Never    Birth control/protection: None  Other Topics Concern   Not on file  Social History Narrative   Not on file   Social Determinants of Health   Financial Resource Strain: Low Risk  (11/22/2020)   Overall Financial Resource Strain (CARDIA)    Difficulty of Paying Living Expenses: Not hard at all  Food Insecurity: No Food Insecurity (11/22/2020)   Hunger Vital Sign    Worried About Running Out of Food in the Last Year: Never true    Ran Out of Food in the Last Year: Never true  Transportation Needs: No Transportation  Needs (11/22/2020)   PRAPARE - Administrator, Civil Service (Medical): No    Lack of Transportation (Non-Medical): No  Physical Activity: Insufficiently Active (11/22/2020)   Exercise Vital Sign    Days of Exercise per Week: 2 days    Minutes of Exercise per Session: 10 min  Stress: No Stress Concern Present (11/22/2020)   [employer] of Occupational Health - Occupational Stress Questionnaire    Feeling of Stress : Only a little  Social Connections: Socially Isolated (11/22/2020)   Social Connection and Isolation Panel [NHANES]    Frequency of Communication with Friends and Family: Never    Frequency of Social Gatherings with Friends and Family: Never    Attends Religious Services: Never    Database administrator or Organizations: Yes    Attends Banker Meetings: Never    Marital Status: Never married    Review of Systems: Gen: Denies fever, chills, anorexia. Denies fatigue, weakness, weight loss.  CV: Denies chest pain, palpitations, syncope, peripheral edema, and claudication. Resp: Denies dyspnea at rest, cough, wheezing, coughing up blood, and pleurisy. GI: Denies vomiting blood, jaundice, and fecal incontinence.   Denies dysphagia or odynophagia. Derm: Denies rash, itching, dry skin Psych: Denies depression, anxiety, memory loss, confusion. No homicidal or suicidal ideation.  Heme: Denies bruising, bleeding, and enlarged lymph nodes.  Physical Exam: There were no vitals taken for this visit. General:   Alert and oriented. No distress noted. Pleasant and cooperative.  Head:  Normocephalic and atraumatic. Eyes:  Conjuctiva clear without scleral icterus. Heart:  S1, S2 present without murmurs appreciated. Lungs:  Clear to auscultation bilaterally. No wheezes, rales, or rhonchi. No distress.  Abdomen:  +BS, soft, non-tender and non-distended. No rebound or guarding. No HSM or masses noted. Msk:  Symmetrical without gross deformities. Normal  posture. Extremities:  Without edema. Neurologic:  Alert and  oriented x4 Psych:  Normal mood and affect.    Assessment:     Plan:  ***   Ermalinda Memos, PA-C [facility] 09/20/2023

## 2023-09-19 ENCOUNTER — Ambulatory Visit (HOSPITAL_COMMUNITY)
Admission: RE | Admit: 2023-09-19 | Discharge: 2023-09-19 | Disposition: A | Discharge status: Home / Self Care | Payer: Medicaid Other | Source: Ambulatory Visit | Attending: *Deleted | Admitting: *Deleted

## 2023-09-19 DIAGNOSIS — Z1231 Encounter for screening mammogram for malignant neoplasm of breast: Secondary | ICD-10-CM | POA: Insufficient documentation

## 2023-09-20 ENCOUNTER — Encounter: Payer: Self-pay | Admitting: Gastroenterology

## 2023-09-20 ENCOUNTER — Ambulatory Visit: Payer: Medicaid Other | Admitting: Gastroenterology

## 2023-09-20 VITALS — BP 114/76 | HR 91 | Temp 97.7°F | Ht 66.0 in | Wt 237.8 lb

## 2023-09-20 DIAGNOSIS — R1013 Epigastric pain: Secondary | ICD-10-CM | POA: Diagnosis not present

## 2023-09-20 DIAGNOSIS — K219 Gastro-esophageal reflux disease without esophagitis: Secondary | ICD-10-CM

## 2023-09-20 NOTE — Patient Instructions (Addendum)
Continue Dexilant 60 mg daily.  Follow a GERD diet:  Avoid fried, fatty, greasy, spicy, citrus foods. Avoid caffeine and carbonated beverages. Avoid chocolate. Try eating 4-6 small meals a day rather than 3 large meals. Do not eat within 3 hours of laying down. Prop head of bed up on wood or bricks to create a 6 inch incline.  I will plan to see back in 6 months or sooner if needed.  It was good to see you again today!  Have a Happy Thanksgiving a [place] Christmas!   Ermalinda Memos, PA-C [facility]

## 2023-10-19 ENCOUNTER — Encounter: Payer: Self-pay | Admitting: Obstetrics & Gynecology

## 2023-10-19 ENCOUNTER — Ambulatory Visit: Payer: Medicaid Other | Admitting: Obstetrics & Gynecology

## 2023-10-19 ENCOUNTER — Other Ambulatory Visit (HOSPITAL_COMMUNITY)
Admission: RE | Admit: 2023-10-19 | Discharge: 2023-10-19 | Disposition: A | Discharge status: Home / Self Care | Payer: Medicaid Other | Source: Ambulatory Visit | Attending: Obstetrics & Gynecology | Admitting: Obstetrics & Gynecology

## 2023-10-19 VITALS — BP 103/69 | HR 79 | Wt 232.0 lb

## 2023-10-19 DIAGNOSIS — D219 Benign neoplasm of connective and other soft tissue, unspecified: Secondary | ICD-10-CM

## 2023-10-19 DIAGNOSIS — Z01419 Encounter for gynecological examination (general) (routine) without abnormal findings: Secondary | ICD-10-CM

## 2023-10-19 DIAGNOSIS — N938 Other specified abnormal uterine and vaginal bleeding: Secondary | ICD-10-CM

## 2023-10-19 DIAGNOSIS — Z124 Encounter for screening for malignant neoplasm of cervix: Secondary | ICD-10-CM

## 2023-10-19 DIAGNOSIS — N946 Dysmenorrhea, unspecified: Secondary | ICD-10-CM | POA: Diagnosis not present

## 2023-10-19 DIAGNOSIS — Z1151 Encounter for screening for human papillomavirus (HPV): Secondary | ICD-10-CM

## 2023-10-19 NOTE — Addendum Note (Signed)
Addended by: Moss Mc on: 10/19/2023 10:18 AM   Modules accepted: Orders

## 2023-10-19 NOTE — Progress Notes (Signed)
Subjective:     Madeline Tucker is a 47 y.o. female here for a routine exam.  Patient's last menstrual period was 07/24/2023 (approximate). [id number] Birth Control Method:  myfembree Menstrual Calendar(currently): amenorrheic  Current complaints: none.   Current acute medical issues:  on myfembree to manage cycles   Recent Gynecologic History Patient's last menstrual period was 07/24/2023 (approximate). Last Pap: 2/2022normal Last mammogram: 09/19/23,  normal  Past Medical History:  Diagnosis Date   Anemia    Anxiety    Mental disorder    Obesity (BMI 30-39.9)     Past Surgical History:  Procedure Laterality Date   BIOPSY  01/13/2022   Procedure: BIOPSY;  Surgeon: Lanelle Bal, DO;  [facility]: [facility];  Service: Endoscopy;;   COLONOSCOPY WITH PROPOFOL N/A 12/27/2020    Surgeon: Lanelle Bal, DO;  Nonbleeding internal hemorrhoids, 2 mm polyp in sigmoid colon resected and retrieved (tubular adenoma).  Recommended repeat colonoscopy in 5 years.   ESOPHAGOGASTRODUODENOSCOPY (EGD) WITH PROPOFOL N/A 01/13/2022   Surgeon: Lanelle Bal, DO;  Gastritis biopsied, normal examined duodenum.  Pathology with patchy chronic gastritis, negative for H. pylori.   POLYPECTOMY  12/27/2020   Procedure: POLYPECTOMY;  Surgeon: Lanelle Bal, DO;  [facility]: [facility];  Service: Endoscopy;;    OB History     Gravida  0   Para  0   Term  0   Preterm  0   AB  0   Living  0      SAB  0   IAB  0   Ectopic  0   Multiple  0   Live Births  0           Social History   Socioeconomic History   Marital status: Single    Spouse name: Not on file   Number of children: Not on file   Years of education: Not on file   Highest education level: Not on file  Occupational History   Not on file  Tobacco Use   Smoking status: Never   Smokeless tobacco: Never  Vaping Use   Vaping status: Never Used  Substance and Sexual Activity   Alcohol use: Never   Drug  use: Never   Sexual activity: Never    Birth control/protection: None  Other Topics Concern   Not on file  Social History Narrative   Not on file   Social Determinants of Health   Financial Resource Strain: Low Risk  (11/22/2020)   Overall Financial Resource Strain (CARDIA)    Difficulty of Paying Living Expenses: Not hard at all  Food Insecurity: No Food Insecurity (11/22/2020)   Hunger Vital Sign    Worried About Running Out of Food in the Last Year: Never true    Ran Out of Food in the Last Year: Never true  Transportation Needs: No Transportation Needs (11/22/2020)   PRAPARE - Administrator, Civil Service (Medical): No    Lack of Transportation (Non-Medical): No  Physical Activity: Insufficiently Active (11/22/2020)   Exercise Vital Sign    Days of Exercise per Week: 2 days    Minutes of Exercise per Session: 10 min  Stress: No Stress Concern Present (11/22/2020)   [employer] of Occupational Health - Occupational Stress Questionnaire    Feeling of Stress : Only a little  Social Connections: Socially Isolated (11/22/2020)   Social Connection and Isolation Panel [NHANES]    Frequency of Communication with Friends and Family:  Never    Frequency of Social Gatherings with Friends and Family: Never    Attends Religious Services: Never    Database administrator or Organizations: Yes    Attends Banker Meetings: Never    Marital Status: Never married    Family History  Problem Relation Age of Onset   Heart attack Father    Cancer Mother        doesn't know what type.    Diabetes Mother    Stroke Mother      Current Outpatient Medications:    acetaminophen (TYLENOL) 500 MG tablet, Take 2 tablets (1,000 mg total) by mouth 2 (two) times daily as needed., Disp: , Rfl:    Cholecalciferol (VITAMIN D-3) 125 MCG (5000 UT) TABS, Take 1 tablet by mouth daily., Disp: , Rfl:    Cranberry-Vitamin C (AZO CRANBERRY URINARY TRACT PO), Take 2 capsules by mouth  daily., Disp: , Rfl:    dexlansoprazole (DEXILANT) 60 MG capsule, TAKE 1 CAPSULE(60 MG) BY MOUTH DAILY, Disp: 30 capsule, Rfl: 5   dicyclomine (BENTYL) 10 MG capsule, TAKE 1 CAPSULE BY MOUTH EVERY MORNING AND THEN INCREASE TO 1 CAPSULE BY MOUTH THREE TIMES DAILY AS NEEDED, Disp: 270 capsule, Rfl: 3   ferrous sulfate (FEROSUL) 325 (65 FE) MG tablet, TAKE 1 TABLET BY MOUTH DAILY, Disp: 15 tablet, Rfl: 0   MYFEMBREE 40-1-0.5 MG TABS, TAKE 1 TABLET BY MOUTH DAILY, Disp: 30 tablet, Rfl: 11  Review of Systems  Review of Systems  Constitutional: Negative for fever, chills, weight loss, malaise/fatigue and diaphoresis.  HENT: Negative for hearing loss, ear pain, nosebleeds, congestion, sore throat, neck pain, tinnitus and ear discharge.   Eyes: Negative for blurred vision, double vision, photophobia, pain, discharge and redness.  Respiratory: Negative for cough, hemoptysis, sputum production, shortness of breath, wheezing and stridor.   Cardiovascular: Negative for chest pain, palpitations, orthopnea, claudication, leg swelling and PND.  Gastrointestinal: negative for abdominal pain. Negative for heartburn, nausea, vomiting, diarrhea, constipation, blood in stool and melena.  Genitourinary: Negative for dysuria, urgency, frequency, hematuria and flank pain.  Musculoskeletal: Negative for myalgias, back pain, joint pain and falls.  Skin: Negative for itching and rash.  Neurological: Negative for dizziness, tingling, tremors, sensory change, speech change, focal weakness, seizures, loss of consciousness, weakness and headaches.  Endo/Heme/Allergies: Negative for environmental allergies and polydipsia. Does not bruise/bleed easily.  Psychiatric/Behavioral: Negative for depression, suicidal ideas, hallucinations, memory loss and substance abuse. The patient is not nervous/anxious and does not have insomnia.        Objective:  Blood pressure 103/69, pulse 79, weight 232 lb (105.2 kg), last menstrual  period 07/24/2023.   Physical Exam  Vitals reviewed. Constitutional: She is oriented to person, place, and time. She appears well-developed and well-nourished.  HENT:  Head: Normocephalic and atraumatic.        Right Ear: External ear normal.  Left Ear: External ear normal.  Nose: Nose normal.  Mouth/Throat: Oropharynx is clear and moist.  Eyes: Conjunctivae and EOM are normal. Pupils are equal, round, and reactive to light. Right eye exhibits no discharge. Left eye exhibits no discharge. No scleral icterus.  Neck: Normal range of motion. Neck supple. No tracheal deviation present. No thyromegaly present.  Cardiovascular: Normal rate, regular rhythm, normal heart sounds and intact distal pulses.  Exam reveals no gallop and no friction rub.   No murmur heard. Respiratory: Effort normal and breath sounds normal. No respiratory distress. She has no wheezes. She has no  rales. She exhibits no tenderness.  GI: Soft. Bowel sounds are normal. She exhibits no distension and no mass. There is no tenderness. There is no rebound and no guarding.  Genitourinary:  Breasts no masses skin changes or nipple changes bilaterally      Vulva is normal without lesions Vagina is pink moist without discharge Cervix normal in appearance and pap is done Uterus is 20 weeks size fibroids, difficult exam Adnexa is negative with normal sized ovaries   Musculoskeletal: Normal range of motion. She exhibits no edema and no tenderness.  Neurological: She is alert and oriented to person, place, and time. She has normal reflexes. She displays normal reflexes. No cranial nerve deficit. She exhibits normal muscle tone. Coordination normal.  Skin: Skin is warm and dry. No rash noted. No erythema. No pallor.  Psychiatric: She has a normal mood and affect. Her behavior is normal. Judgment and thought content normal.       Medications Ordered at today's visit: No orders of the defined types were placed in this  encounter.   Other orders placed at today's visit: No orders of the defined types were placed in this encounter.    ASSESSMENT + PLAN:    ICD-10-CM   1. Well woman exam with routine gynecological exam  Z01.419     2. Fibroids  D21.9    sonogram 6 weeks, continue myfembree may need to switch off for a while    3. DUB (dysfunctional uterine bleeding): Chronic + stable  N93.8     4. Dysmenorrhea: chronic + stable  N94.6           Return in about 6 weeks (around 11/30/2023) for GYN sono, Follow up, with Dr Despina Hidden.

## 2023-10-24 LAB — CYTOLOGY - PAP
Comment: NEGATIVE
Diagnosis: UNDETERMINED — AB
High risk HPV: NEGATIVE

## 2023-11-19 ENCOUNTER — Other Ambulatory Visit: Payer: Self-pay | Admitting: Gastroenterology

## 2023-11-19 DIAGNOSIS — K219 Gastro-esophageal reflux disease without esophagitis: Secondary | ICD-10-CM

## 2023-11-19 DIAGNOSIS — R079 Chest pain, unspecified: Secondary | ICD-10-CM

## 2023-11-27 ENCOUNTER — Ambulatory Visit: Payer: Medicaid Other | Admitting: Obstetrics & Gynecology

## 2023-12-06 ENCOUNTER — Ambulatory Visit: Payer: Medicaid Other | Admitting: Obstetrics & Gynecology

## 2023-12-06 ENCOUNTER — Encounter: Payer: Self-pay | Admitting: Obstetrics & Gynecology

## 2023-12-06 VITALS — BP 112/88 | HR 90 | Ht 66.5 in | Wt 228.0 lb

## 2023-12-06 DIAGNOSIS — D219 Benign neoplasm of connective and other soft tissue, unspecified: Secondary | ICD-10-CM | POA: Diagnosis not present

## 2023-12-06 NOTE — Progress Notes (Signed)
Follow up appointment: Myfembree therapy  Chief Complaint  Patient presents with   Follow-up    Not bleeding now since starting myfembree    Blood pressure 112/88, pulse 90, height 5' 6.5" (1.689 m), weight 228 lb (103.4 kg).  Sonogram not scheduled or done today Exam>20 weeks size uterus, multiple fibroids palpable  MEDS ordered this encounter: No orders of the defined types were placed in this encounter.   Orders for this encounter: No orders of the defined types were placed in this encounter.   Impression + Management Plan   ICD-10-CM   1. Fibroids: 20 weeks size, no bleeding or problems on myfembree  D21.9       Follow Up: Return in about 6 months (around 06/04/2024) for GYN sono, Follow up, with Dr Despina Hidden.     All questions were answered.  Past Medical History:  Diagnosis Date   Anemia    Anxiety    Mental disorder    Obesity (BMI 30-39.9)     Past Surgical History:  Procedure Laterality Date   BIOPSY  01/13/2022   Procedure: BIOPSY;  Surgeon: Lanelle Bal, DO;  [facility]: [facility];  Service: Endoscopy;;   COLONOSCOPY WITH PROPOFOL N/A 12/27/2020    Surgeon: Lanelle Bal, DO;  Nonbleeding internal hemorrhoids, 2 mm polyp in sigmoid colon resected and retrieved (tubular adenoma).  Recommended repeat colonoscopy in 5 years.   ESOPHAGOGASTRODUODENOSCOPY (EGD) WITH PROPOFOL N/A 01/13/2022   Surgeon: Lanelle Bal, DO;  Gastritis biopsied, normal examined duodenum.  Pathology with patchy chronic gastritis, negative for H. pylori.   POLYPECTOMY  12/27/2020   Procedure: POLYPECTOMY;  Surgeon: Lanelle Bal, DO;  [facility];  Service: Endoscopy;;    OB History     Gravida  0   Para  0   Term  0   Preterm  0   AB  0   Living  0      SAB  0   IAB  0   Ectopic  0   Multiple  0   Live Births  0           No Known Allergies  Social History   Socioeconomic History   Marital status: Single    Spouse  name: Not on file   Number of children: Not on file   Years of education: Not on file   Highest education level: Not on file  Occupational History   Not on file  Tobacco Use   Smoking status: Never   Smokeless tobacco: Never  Vaping Use   Vaping status: Never Used  Substance and Sexual Activity   Alcohol use: Never   Drug use: Never   Sexual activity: Never    Birth control/protection: None  Other Topics Concern   Not on file  Social History Narrative   Not on file   Social Drivers of Health   Financial Resource Strain: Low Risk  (11/22/2020)   Overall Financial Resource Strain (CARDIA)    Difficulty of Paying Living Expenses: Not hard at all  Food Insecurity: No Food Insecurity (11/22/2020)   Hunger Vital Sign    Worried About Running Out of Food in the Last Year: Never true    Ran Out of Food in the Last Year: Never true  Transportation Needs: No Transportation Needs (11/22/2020)   PRAPARE - Administrator, Civil Service (Medical): No    Lack of Transportation (Non-Medical): No  Physical Activity: Insufficiently Active (11/22/2020)  Exercise Vital Sign    Days of Exercise per Week: 2 days    Minutes of Exercise per Session: 10 min  Stress: No Stress Concern Present (11/22/2020)   [employer] of Occupational Health - Occupational Stress Questionnaire    Feeling of Stress : Only a little  Social Connections: Socially Isolated (11/22/2020)   Social Connection and Isolation Panel [NHANES]    Frequency of Communication with Friends and Family: Never    Frequency of Social Gatherings with Friends and Family: Never    Attends Religious Services: Never    Database administrator or Organizations: Yes    Attends Banker Meetings: Never    Marital Status: Never married    Family History  Problem Relation Age of Onset   Heart attack Father    Cancer Mother        doesn't know what type.    Diabetes Mother    Stroke Mother

## 2024-03-16 NOTE — Progress Notes (Unsigned)
 Referring Provider: Bolivar Bushman., PA-C Primary Care Physician:  Bolivar Bushman., PA-C Primary GI Physician: Dr. Mordechai April  No chief complaint on file.   HPI:   Madeline Tucker is a 48 y.o. female with history of anxiety, mental disorder, atypical chest pain previously improved with PPI, recurrence later this year suspected be MSK and improved with ibuprofen, chronic intermittent RLQ abdominal pain suspect to be secondary to IBS and improved with dicyclomine , atypical chest pain improved with PPI, presenting today for 60-month follow-up.    Last seen in the office 09/20/2023.  She continued to do well overall after switching omeprazole  to Dexilant  60 mg daily in February and avoiding ibuprofen.  She had occasional short-lived upper abdominal cramping a couple times a week, but no identified trigger and denied postprandial symptoms.  Also without nausea or vomiting.  No additional workup recommended.  Suspected prior symptoms were secondary to GERD/gastritis.   Today:      Past Medical History:  Diagnosis Date   Anemia    Anxiety    Mental disorder    Obesity (BMI 30-39.9)     Past Surgical History:  Procedure Laterality Date   BIOPSY  01/13/2022   Procedure: BIOPSY;  Surgeon: Vinetta Greening, DO;  [facility]: [facility];  Service: Endoscopy;;   COLONOSCOPY WITH PROPOFOL  N/A 12/27/2020    Surgeon: Vinetta Greening, DO;  Nonbleeding internal hemorrhoids, 2 mm polyp in sigmoid colon resected and retrieved (tubular adenoma).  Recommended repeat colonoscopy in 5 years.   ESOPHAGOGASTRODUODENOSCOPY (EGD) WITH PROPOFOL  N/A 01/13/2022   Surgeon: Vinetta Greening, DO;  Gastritis biopsied, normal examined duodenum.  Pathology with patchy chronic gastritis, negative for H. pylori.   POLYPECTOMY  12/27/2020   Procedure: POLYPECTOMY;  Surgeon: Vinetta Greening, DO;  [facility]: [facility];  Service: Endoscopy;;    Current Outpatient Medications  Medication Sig Dispense Refill    acetaminophen  (TYLENOL ) 500 MG tablet Take 2 tablets (1,000 mg total) by mouth 2 (two) times daily as needed.     Cholecalciferol (VITAMIN D-3) 125 MCG (5000 UT) TABS Take 1 tablet by mouth daily.     Cranberry-Vitamin C (AZO CRANBERRY URINARY TRACT PO) Take 2 capsules by mouth daily.     dexlansoprazole  (DEXILANT ) 60 MG capsule TAKE 1 CAPSULE(60 MG) BY MOUTH DAILY 30 capsule 5   dicyclomine  (BENTYL ) 10 MG capsule TAKE 1 CAPSULE BY MOUTH EVERY MORNING AND THEN INCREASE TO 1 CAPSULE BY MOUTH THREE TIMES DAILY AS NEEDED 270 capsule 3   ferrous sulfate  (FEROSUL) 325 (65 FE) MG tablet TAKE 1 TABLET BY MOUTH DAILY 15 tablet 0   MYFEMBREE  40-1-0.5 MG TABS TAKE 1 TABLET BY MOUTH DAILY 30 tablet 11   No current facility-administered medications for this visit.    Allergies as of 03/19/2024   (No Known Allergies)    Family History  Problem Relation Age of Onset   Heart attack Father    Cancer Mother        doesn't know what type.    Diabetes Mother    Stroke Mother     Social History   Socioeconomic History   Marital status: Single    Spouse name: Not on file   Number of children: Not on file   Years of education: Not on file   Highest education level: Not on file  Occupational History   Not on file  Tobacco Use   Smoking status: Never   Smokeless tobacco: Never  Vaping  Use   Vaping status: Never Used  Substance and Sexual Activity   Alcohol use: Never   Drug use: Never   Sexual activity: Never    Birth control/protection: None  Other Topics Concern   Not on file  Social History Narrative   Not on file   Social Drivers of Health   Financial Resource Strain: Low Risk  (11/22/2020)   Overall Financial Resource Strain (CARDIA)    Difficulty of Paying Living Expenses: Not hard at all  Food Insecurity: No Food Insecurity (11/22/2020)   Hunger Vital Sign    Worried About Running Out of Food in the Last Year: Never true    Ran Out of Food in the Last Year: Never true   Transportation Needs: No Transportation Needs (11/22/2020)   PRAPARE - Administrator, Civil Service (Medical): No    Lack of Transportation (Non-Medical): No  Physical Activity: Insufficiently Active (11/22/2020)   Exercise Vital Sign    Days of Exercise per Week: 2 days    Minutes of Exercise per Session: 10 min  Stress: No Stress Concern Present (11/22/2020)   [employer] of Occupational Health - Occupational Stress Questionnaire    Feeling of Stress : Only a little  Social Connections: Socially Isolated (11/22/2020)   Social Connection and Isolation Panel [NHANES]    Frequency of Communication with Friends and Family: Never    Frequency of Social Gatherings with Friends and Family: Never    Attends Religious Services: Never    Database administrator or Organizations: Yes    Attends Banker Meetings: Never    Marital Status: Never married    Review of Systems: Gen: Denies fever, chills, anorexia. Denies fatigue, weakness, weight loss.  CV: Denies chest pain, palpitations, syncope, peripheral edema, and claudication. Resp: Denies dyspnea at rest, cough, wheezing, coughing up blood, and pleurisy. GI: Denies vomiting blood, jaundice, and fecal incontinence.   Denies dysphagia or odynophagia. Derm: Denies rash, itching, dry skin Psych: Denies depression, anxiety, memory loss, confusion. No homicidal or suicidal ideation.  Heme: Denies bruising, bleeding, and enlarged lymph nodes.  Physical Exam: There were no vitals taken for this visit. General:   Alert and oriented. No distress noted. Pleasant and cooperative.  Head:  Normocephalic and atraumatic. Eyes:  Conjuctiva clear without scleral icterus. Heart:  S1, S2 present without murmurs appreciated. Lungs:  Clear to auscultation bilaterally. No wheezes, rales, or rhonchi. No distress.  Abdomen:  +BS, soft, non-tender and non-distended. No rebound or guarding. No HSM or masses noted. Msk:  Symmetrical  without gross deformities. Normal posture. Extremities:  Without edema. Neurologic:  Alert and  oriented x4 Psych:  Normal mood and affect.    Assessment:     Plan:  ***   Shana Daring, PA-C [facility] 03/19/2024

## 2024-03-19 ENCOUNTER — Encounter: Payer: Self-pay | Admitting: Gastroenterology

## 2024-03-19 ENCOUNTER — Ambulatory Visit: Payer: Medicaid Other | Admitting: Gastroenterology

## 2024-03-19 VITALS — BP 117/80 | HR 91 | Temp 97.6°F | Ht 67.0 in | Wt 231.6 lb

## 2024-03-19 DIAGNOSIS — R1013 Epigastric pain: Secondary | ICD-10-CM

## 2024-03-19 DIAGNOSIS — K219 Gastro-esophageal reflux disease without esophagitis: Secondary | ICD-10-CM

## 2024-03-19 DIAGNOSIS — K5900 Constipation, unspecified: Secondary | ICD-10-CM

## 2024-03-19 NOTE — Patient Instructions (Signed)
 Continue Dexilant  60 mg daily.  Start MiraLAX 1 capful (17 g) daily in 8 ounces of water or other noncarbonated beverage of your choice.  After taking MiraLAX daily for 1 week, if your bowels are moving well, you can try decreasing MiraLAX to every other day in order to prevent loose stools.  However, if you have return of constipation with decreasing MiraLAX over the day, you should resume taking MiraLAX daily.  Start Benefiber 2 teaspoons daily x 2 weeks, then increase to twice daily.  Continue this indefinitely.  I will plan to see back in about 6 weeks or sooner if needed.  Shana Daring, PA-C [facility]

## 2024-04-29 NOTE — Progress Notes (Signed)
 Referring Provider: Bolivar Bushman., PA-C Primary Care Physician:  Bolivar Bushman., PA-C Primary GI Physician: Dr. Mordechai April  No chief complaint on file.   HPI:   Madeline Tucker is a 48 y.o. female with history of anxiety, mental disorder, atypical chest pain likely secondary to atypical GERD responding to PPI +/- MSK component previously as well,  chronic intermittent RLQ abdominal pain suspect to be secondary to IBS and improved with dicyclomine , presenting today for follow-up of epigastric pain and constipation.   Last seen in the office 03/19/2024 reporting 1-2 months of intermittent epigastric pain worsened postprandially, but improved with bowel movement.  Denied nausea or vomiting.  Reported sensation of needing to have a bowel movement, but unable and only having a bowel movement 1-2 times a week that was nonproductive.  Chronic GERD remained well-controlled on Dexilant  daily.  Abdominal exam benign.  Planned to focus on improving constipation with the addition of MiraLAX and Benefiber daily and monitoring for improvement in epigastric pain.  If persistent symptoms, would need to consider repeating EGD.  Today:     Last EGD 01/13/2022: Gastritis biopsied, otherwise normal exam. Pathology showed chronic gastritis, negative for H. pylori.   RUQ ultrasound 05/23/2022 with no evidence of cholelithiasis or cholecystitis, normal CBD. Normal liver.   Past Medical History:  Diagnosis Date   Anemia    Anxiety    Mental disorder    Obesity (BMI 30-39.9)     Past Surgical History:  Procedure Laterality Date   BIOPSY  01/13/2022   Procedure: BIOPSY;  Surgeon: Vinetta Greening, DO;  Location: [facility];  Service: Endoscopy;;   COLONOSCOPY WITH PROPOFOL  N/A 12/27/2020    Surgeon: Vinetta Greening, DO;  Nonbleeding internal hemorrhoids, 2 mm polyp in sigmoid colon resected and retrieved (tubular adenoma).  Recommended repeat colonoscopy in 5 years.   ESOPHAGOGASTRODUODENOSCOPY  (EGD) WITH PROPOFOL  N/A 01/13/2022   Surgeon: Vinetta Greening, DO;  Gastritis biopsied, normal examined duodenum.  Pathology with patchy chronic gastritis, negative for H. pylori.   POLYPECTOMY  12/27/2020   Procedure: POLYPECTOMY;  Surgeon: Vinetta Greening, DO;  Location: [facility];  Service: Endoscopy;;    Current Outpatient Medications  Medication Sig Dispense Refill   acetaminophen  (TYLENOL ) 500 MG tablet Take 2 tablets (1,000 mg total) by mouth 2 (two) times daily as needed.     Cholecalciferol (VITAMIN D-3) 125 MCG (5000 UT) TABS Take 1 tablet by mouth daily.     Cranberry-Vitamin C (AZO CRANBERRY URINARY TRACT PO) Take 2 capsules by mouth daily.     dexlansoprazole  (DEXILANT ) 60 MG capsule TAKE 1 CAPSULE(60 MG) BY MOUTH DAILY 30 capsule 5   dicyclomine  (BENTYL ) 10 MG capsule TAKE 1 CAPSULE BY MOUTH EVERY MORNING AND THEN INCREASE TO 1 CAPSULE BY MOUTH THREE TIMES DAILY AS NEEDED 270 capsule 3   ferrous sulfate  (FEROSUL) 325 (65 FE) MG tablet TAKE 1 TABLET BY MOUTH DAILY 15 tablet 0   MYFEMBREE  40-1-0.5 MG TABS TAKE 1 TABLET BY MOUTH DAILY 30 tablet 11   polyethylene glycol (MIRALAX / GLYCOLAX) 17 g packet Take 17 g by mouth daily.     No current facility-administered medications for this visit.    Allergies as of 05/01/2024   (No Known Allergies)    Family History  Problem Relation Age of Onset   Heart attack Father    Cancer Mother        doesn't know what type.    Diabetes Mother  Stroke Mother     Social History   Socioeconomic History   Marital status: Single    Spouse name: Not on file   Number of children: Not on file   Years of education: Not on file   Highest education level: Not on file  Occupational History   Not on file  Tobacco Use   Smoking status: Never   Smokeless tobacco: Never  Vaping Use   Vaping status: Never Used  Substance and Sexual Activity   Alcohol use: Never   Drug use: Never   Sexual activity: Never    Birth  control/protection: None  Other Topics Concern   Not on file  Social History Narrative   Not on file   Social Drivers of Health   Financial Resource Strain: Low Risk  (11/22/2020)   Overall Financial Resource Strain (CARDIA)    Difficulty of Paying Living Expenses: Not hard at all  Food Insecurity: No Food Insecurity (11/22/2020)   Hunger Vital Sign    Worried About Running Out of Food in the Last Year: Never true    Ran Out of Food in the Last Year: Never true  Transportation Needs: No Transportation Needs (11/22/2020)   PRAPARE - Administrator, Civil Service (Medical): No    Lack of Transportation (Non-Medical): No  Physical Activity: Insufficiently Active (11/22/2020)   Exercise Vital Sign    Days of Exercise per Week: 2 days    Minutes of Exercise per Session: 10 min  Stress: No Stress Concern Present (11/22/2020)   [employer] of Occupational Health - Occupational Stress Questionnaire    Feeling of Stress : Only a little  Social Connections: Socially Isolated (11/22/2020)   Social Connection and Isolation Panel    Frequency of Communication with Friends and Family: Never    Frequency of Social Gatherings with Friends and Family: Never    Attends Religious Services: Never    Database administrator or Organizations: Yes    Attends Banker Meetings: Never    Marital Status: Never married    Review of Systems: Gen: Denies fever, chills, anorexia. Denies fatigue, weakness, weight loss.  CV: Denies chest pain, palpitations, syncope, peripheral edema, and claudication. Resp: Denies dyspnea at rest, cough, wheezing, coughing up blood, and pleurisy. GI: Denies vomiting blood, jaundice, and fecal incontinence.   Denies dysphagia or odynophagia. Derm: Denies rash, itching, dry skin Psych: Denies depression, anxiety, memory loss, confusion. No homicidal or suicidal ideation.  Heme: Denies bruising, bleeding, and enlarged lymph nodes.  Physical  Exam: There were no vitals taken for this visit. General:   Alert and oriented. No distress noted. Pleasant and cooperative.  Head:  Normocephalic and atraumatic. Eyes:  Conjuctiva clear without scleral icterus. Heart:  S1, S2 present without murmurs appreciated. Lungs:  Clear to auscultation bilaterally. No wheezes, rales, or rhonchi. No distress.  Abdomen:  +BS, soft, non-tender and non-distended. No rebound or guarding. No HSM or masses noted. Msk:  Symmetrical without gross deformities. Normal posture. Extremities:  Without edema. Neurologic:  Alert and  oriented x4 Psych:  Normal mood and affect.    Assessment:     Plan:  ***   Shana Daring, PA-C [facility] 05/01/2024

## 2024-05-01 ENCOUNTER — Ambulatory Visit: Admitting: Gastroenterology

## 2024-05-01 ENCOUNTER — Encounter: Payer: Self-pay | Admitting: Gastroenterology

## 2024-05-01 VITALS — BP 128/78 | HR 93 | Temp 97.8°F | Ht 66.0 in | Wt 233.8 lb

## 2024-05-01 DIAGNOSIS — K219 Gastro-esophageal reflux disease without esophagitis: Secondary | ICD-10-CM | POA: Diagnosis not present

## 2024-05-01 DIAGNOSIS — K5900 Constipation, unspecified: Secondary | ICD-10-CM | POA: Diagnosis not present

## 2024-05-01 DIAGNOSIS — R1013 Epigastric pain: Secondary | ICD-10-CM

## 2024-05-01 NOTE — Patient Instructions (Signed)
 I am glad you are feeling much better!  Continue Benefiber 1 packet daily.  Continue to use MiraLAX 1 capful (17 g) mixed in 8 ounces of water as needed if constipation returns.  Continue Dexilant  60 mg daily.   I will plan to see back in 6 months or sooner if needed.  Shana Daring, PA-C [facility]

## 2024-05-09 ENCOUNTER — Telehealth: Payer: Self-pay | Admitting: Gastroenterology

## 2024-05-09 NOTE — Telephone Encounter (Signed)
 Received labs dated 05/06/2024.  CBC, CMP, TSH, B12 all within normal limits.  No additional recommendations.

## 2024-06-02 ENCOUNTER — Ambulatory Visit: Admitting: Obstetrics & Gynecology

## 2024-06-03 ENCOUNTER — Ambulatory Visit: Admitting: Obstetrics & Gynecology

## 2024-06-22 ENCOUNTER — Other Ambulatory Visit: Payer: Self-pay | Admitting: Gastroenterology

## 2024-06-22 ENCOUNTER — Other Ambulatory Visit: Payer: Self-pay | Admitting: Obstetrics & Gynecology

## 2024-06-22 DIAGNOSIS — K219 Gastro-esophageal reflux disease without esophagitis: Secondary | ICD-10-CM

## 2024-06-22 DIAGNOSIS — R079 Chest pain, unspecified: Secondary | ICD-10-CM

## 2024-06-22 DIAGNOSIS — R109 Unspecified abdominal pain: Secondary | ICD-10-CM

## 2024-07-03 ENCOUNTER — Encounter: Payer: Self-pay | Admitting: Obstetrics & Gynecology

## 2024-07-03 ENCOUNTER — Ambulatory Visit: Admitting: Obstetrics & Gynecology

## 2024-07-03 VITALS — BP 116/74 | HR 75 | Ht 67.0 in | Wt 233.5 lb

## 2024-07-03 DIAGNOSIS — D219 Benign neoplasm of connective and other soft tissue, unspecified: Secondary | ICD-10-CM | POA: Diagnosis not present

## 2024-07-03 NOTE — Progress Notes (Signed)
 Follow up appointment for results: Fibroids on myfembree   Chief Complaint  Patient presents with   Follow-up    Per patient check up    Blood pressure 116/74, pulse 75, height 5' 7 (1.702 m), weight 233 lb 8 oz (105.9 kg).  Off myfembree  for about 2 years Pt wants to continue No bleeding  Minimal pain 20 week fibroids Does not want surgery  MEDS ordered this encounter: No orders of the defined types were placed in this encounter.   Orders for this encounter: No orders of the defined types were placed in this encounter.   Impression + Management Plan   ICD-10-CM   1. Fibroids: 20 weeks size, no bleeding or problems on myfembree   D21.9       Follow Up: Return in about 6 months (around 01/03/2025) for yearly exam witrh Dr Jayne.     All questions were answered.  Past Medical History:  Diagnosis Date   Anemia    Anxiety    Mental disorder    Obesity (BMI 30-39.9)     Past Surgical History:  Procedure Laterality Date   BIOPSY  01/13/2022   Procedure: BIOPSY;  Surgeon: Cindie Carlin POUR, DO;  Location: [facility];  Service: Endoscopy;;   COLONOSCOPY WITH PROPOFOL  N/A 12/27/2020    Surgeon: Cindie Carlin POUR, DO;  Nonbleeding internal hemorrhoids, 2 mm polyp in sigmoid colon resected and retrieved (tubular adenoma).  Recommended repeat colonoscopy in 5 years.   ESOPHAGOGASTRODUODENOSCOPY (EGD) WITH PROPOFOL  N/A 01/13/2022   Surgeon: Cindie Carlin POUR, DO;  Gastritis biopsied, normal examined duodenum.  Pathology with patchy chronic gastritis, negative for H. pylori.   POLYPECTOMY  12/27/2020   Procedure: POLYPECTOMY;  Surgeon: Cindie Carlin POUR, DO;  Location: [facility];  Service: Endoscopy;;    OB History     Gravida  0   Para  0   Term  0   Preterm  0   AB  0   Living  0      SAB  0   IAB  0   Ectopic  0   Multiple  0   Live Births  0           No Known Allergies  Social History   Socioeconomic History   Marital status:  Single    Spouse name: Not on file   Number of children: Not on file   Years of education: Not on file   Highest education level: Not on file  Occupational History   Not on file  Tobacco Use   Smoking status: Never   Smokeless tobacco: Never  Vaping Use   Vaping status: Never Used  Substance and Sexual Activity   Alcohol use: Never   Drug use: Never   Sexual activity: Never    Birth control/protection: None  Other Topics Concern   Not on file  Social History Narrative   Not on file   Social Drivers of Health   Financial Resource Strain: Low Risk  (11/22/2020)   Overall Financial Resource Strain (CARDIA)    Difficulty of Paying Living Expenses: Not hard at all  Food Insecurity: No Food Insecurity (11/22/2020)   Hunger Vital Sign    Worried About Running Out of Food in the Last Year: Never true    Ran Out of Food in the Last Year: Never true  Transportation Needs: No Transportation Needs (11/22/2020)   PRAPARE - Transportation    Lack of Transportation (Medical): No    Lack  of Transportation (Non-Medical): No  Physical Activity: Insufficiently Active (11/22/2020)   Exercise Vital Sign    Days of Exercise per Week: 2 days    Minutes of Exercise per Session: 10 min  Stress: No Stress Concern Present (11/22/2020)   [employer] of Occupational Health - Occupational Stress Questionnaire    Feeling of Stress : Only a little  Social Connections: Socially Isolated (11/22/2020)   Social Connection and Isolation Panel    Frequency of Communication with Friends and Family: Never    Frequency of Social Gatherings with Friends and Family: Never    Attends Religious Services: Never    Database administrator or Organizations: Yes    Attends Banker Meetings: Never    Marital Status: Never married    Family History  Problem Relation Age of Onset   Heart attack Father    Cancer Mother        doesn't know what type.    Diabetes Mother    Stroke Mother

## 2024-08-19 ENCOUNTER — Other Ambulatory Visit (HOSPITAL_COMMUNITY): Payer: Self-pay | Admitting: *Deleted

## 2024-08-19 DIAGNOSIS — Z1231 Encounter for screening mammogram for malignant neoplasm of breast: Secondary | ICD-10-CM

## 2024-09-22 ENCOUNTER — Ambulatory Visit (HOSPITAL_COMMUNITY)
Admission: RE | Admit: 2024-09-22 | Discharge: 2024-09-22 | Disposition: A | Discharge status: Home / Self Care | Source: Ambulatory Visit | Attending: *Deleted | Admitting: *Deleted

## 2024-09-22 DIAGNOSIS — Z1231 Encounter for screening mammogram for malignant neoplasm of breast: Secondary | ICD-10-CM | POA: Diagnosis present

## 2024-10-07 ENCOUNTER — Encounter: Payer: Self-pay | Admitting: Gastroenterology

## 2024-12-14 ENCOUNTER — Encounter (INDEPENDENT_AMBULATORY_CARE_PROVIDER_SITE_OTHER): Payer: Self-pay

## 2024-12-15 ENCOUNTER — Ambulatory Visit: Admitting: Gastroenterology

## 2025-01-05 ENCOUNTER — Ambulatory Visit: Admitting: Obstetrics & Gynecology

## 2025-01-06 ENCOUNTER — Ambulatory Visit: Admitting: Gastroenterology
